# Patient Record
Sex: Female | Born: 1990 | Hispanic: No | Marital: Single | Smoking: Never smoker
Health system: Southern US, Community
[De-identification: ages and names within clinical notes are randomized; demographics above are authoritative.]

## PROBLEM LIST (undated history)

## (undated) DIAGNOSIS — F79 Unspecified intellectual disabilities: Secondary | ICD-10-CM

## (undated) DIAGNOSIS — F602 Antisocial personality disorder: Secondary | ICD-10-CM

## (undated) DIAGNOSIS — F609 Personality disorder, unspecified: Secondary | ICD-10-CM

## (undated) DIAGNOSIS — F319 Bipolar disorder, unspecified: Secondary | ICD-10-CM

## (undated) DIAGNOSIS — E221 Hyperprolactinemia: Secondary | ICD-10-CM

## (undated) DIAGNOSIS — F6081 Narcissistic personality disorder: Secondary | ICD-10-CM

## (undated) DIAGNOSIS — F259 Schizoaffective disorder, unspecified: Secondary | ICD-10-CM

## (undated) DIAGNOSIS — E119 Type 2 diabetes mellitus without complications: Secondary | ICD-10-CM

## (undated) DIAGNOSIS — D649 Anemia, unspecified: Secondary | ICD-10-CM

## (undated) DIAGNOSIS — I10 Essential (primary) hypertension: Secondary | ICD-10-CM

## (undated) DIAGNOSIS — K219 Gastro-esophageal reflux disease without esophagitis: Secondary | ICD-10-CM

## (undated) HISTORY — PX: COLPOSCOPY: SHX161

---
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
## 2018-01-07 ENCOUNTER — Encounter: Payer: Self-pay | Admitting: Emergency Medicine

## 2018-01-07 ENCOUNTER — Emergency Department
Admission: EM | Admit: 2018-01-07 | Discharge: 2018-01-09 | Disposition: A | Discharge status: Home / Self Care | Payer: Medicare Other | Attending: Emergency Medicine | Admitting: Emergency Medicine

## 2018-01-07 ENCOUNTER — Other Ambulatory Visit: Payer: Self-pay

## 2018-01-07 DIAGNOSIS — E119 Type 2 diabetes mellitus without complications: Secondary | ICD-10-CM | POA: Insufficient documentation

## 2018-01-07 DIAGNOSIS — Z765 Malingerer [conscious simulation]: Secondary | ICD-10-CM | POA: Diagnosis not present

## 2018-01-07 DIAGNOSIS — R45851 Suicidal ideations: Secondary | ICD-10-CM | POA: Diagnosis not present

## 2018-01-07 DIAGNOSIS — F259 Schizoaffective disorder, unspecified: Secondary | ICD-10-CM | POA: Insufficient documentation

## 2018-01-07 DIAGNOSIS — F79 Unspecified intellectual disabilities: Secondary | ICD-10-CM

## 2018-01-07 DIAGNOSIS — I10 Essential (primary) hypertension: Secondary | ICD-10-CM | POA: Insufficient documentation

## 2018-01-07 HISTORY — DX: Essential (primary) hypertension: I10

## 2018-01-07 HISTORY — DX: Schizoaffective disorder, unspecified: F25.9

## 2018-01-07 HISTORY — DX: Type 2 diabetes mellitus without complications: E11.9

## 2018-01-07 HISTORY — DX: Personality disorder, unspecified: F60.9

## 2018-01-07 HISTORY — DX: Bipolar disorder, unspecified: F31.9

## 2018-01-07 HISTORY — DX: Unspecified intellectual disabilities: F79

## 2018-01-07 LAB — COMPREHENSIVE METABOLIC PANEL
ALK PHOS: 81 U/L (ref 38–126)
ALT: 66 U/L — AB (ref 0–44)
ANION GAP: 11 (ref 5–15)
AST: 72 U/L — ABNORMAL HIGH (ref 15–41)
Albumin: 3.4 g/dL — ABNORMAL LOW (ref 3.5–5.0)
BILIRUBIN TOTAL: 0.4 mg/dL (ref 0.3–1.2)
BUN: 9 mg/dL (ref 6–20)
CALCIUM: 8.9 mg/dL (ref 8.9–10.3)
CO2: 25 mmol/L (ref 22–32)
CREATININE: 0.79 mg/dL (ref 0.44–1.00)
Chloride: 103 mmol/L (ref 98–111)
Glucose, Bld: 150 mg/dL — ABNORMAL HIGH (ref 70–99)
Potassium: 3.6 mmol/L (ref 3.5–5.1)
Sodium: 139 mmol/L (ref 135–145)
TOTAL PROTEIN: 8.1 g/dL (ref 6.5–8.1)

## 2018-01-07 LAB — URINE DRUG SCREEN, QUALITATIVE (ARMC ONLY)
Amphetamines, Ur Screen: NOT DETECTED
BARBITURATES, UR SCREEN: NOT DETECTED
BENZODIAZEPINE, UR SCRN: NOT DETECTED
COCAINE METABOLITE, UR ~~LOC~~: NOT DETECTED
Cannabinoid 50 Ng, Ur ~~LOC~~: NOT DETECTED
MDMA (Ecstasy)Ur Screen: NOT DETECTED
Methadone Scn, Ur: NOT DETECTED
Opiate, Ur Screen: NOT DETECTED
Phencyclidine (PCP) Ur S: NOT DETECTED
TRICYCLIC, UR SCREEN: POSITIVE — AB

## 2018-01-07 LAB — SALICYLATE LEVEL

## 2018-01-07 LAB — CBC
HCT: 35.1 % (ref 35.0–47.0)
HEMOGLOBIN: 11.9 g/dL — AB (ref 12.0–16.0)
MCH: 29.7 pg (ref 26.0–34.0)
MCHC: 33.8 g/dL (ref 32.0–36.0)
MCV: 87.7 fL (ref 80.0–100.0)
Platelets: 360 10*3/uL (ref 150–440)
RBC: 4 MIL/uL (ref 3.80–5.20)
RDW: 14.7 % — AB (ref 11.5–14.5)
WBC: 7.6 10*3/uL (ref 3.6–11.0)

## 2018-01-07 LAB — ACETAMINOPHEN LEVEL

## 2018-01-07 LAB — POCT PREGNANCY, URINE: Preg Test, Ur: NEGATIVE

## 2018-01-07 LAB — ETHANOL

## 2018-01-07 MED ORDER — ACETAMINOPHEN 325 MG PO TABS
650.0000 mg | ORAL_TABLET | Freq: Once | ORAL | Status: AC
  Administered 2018-01-07: 650 mg via ORAL
  Filled 2018-01-07: qty 2

## 2018-01-07 NOTE — ED Triage Notes (Signed)
Patient states she is feeling suicidal, wants to cut herself to commit suicide. States she is new to current group home, was previously living in [place] for 3 years. States she was recently hospitalized for 1 month there, but unable to tell this RN what she was in hospital for. Patient states she is mostly upset because group home won't let you leave the house (can't leave the yard). Patient also states she had many medication changes prior to going to this group home, unsure of what her medications are. Patient tearful in triage.

## 2018-01-07 NOTE — ED Notes (Signed)
Belongings: Grey socks, white shoes, blue bra, pink shirt, 1 pair jeans (no underwear), urine cup with 2 nuts in it, 1 book bag.   Attempted to contact legal guardian. Went straight to [employer]email. Left message to call back. Legal guardian = Anne Shutteryesha Hewitt [phone]). Group home number: [phone] Jonita Albee(Sonni Stone).

## 2018-01-07 NOTE — ED Provider Notes (Signed)
[facility] Emergency Department Provider Note  ____________________________________________  Time seen: Approximately 8:40 PM  I have reviewed the triage vital signs and the nursing notes.   HISTORY  Chief Complaint Suicidal    HPI Tracie Hanson is a 27 y.o. female with a history of bipolar disorder, personality disorder and schizoaffective disorder, HTN and DM, intellectual disability, presenting for SI with thoughts about cutting herself.  The patient has a new group home situation, and some recent medication changes.  The patient has no medical complaints at this time.  Past Medical History:  Diagnosis Date  . Bipolar 1 disorder (HCC)   . Diabetes mellitus without complication (HCC)   . Hypertension   . Intellectual disability   . Personality disorder (HCC)   . Schizoaffective disorder (HCC)     There are no active problems to display for this patient.   History reviewed. No pertinent surgical history.    Allergies Klonopin [clonazepam] and Other  No family history on file.  Social History Social History   Tobacco Use  . Smoking status: Never Smoker  . Smokeless tobacco: Never Used  Substance Use Topics  . Alcohol use: Never    Frequency: Never  . Drug use: Not on file    Review of Systems Constitutional: No fever/chills. Eyes: No visual changes. ENT: No sore throat. No congestion or rhinorrhea. Cardiovascular: Denies chest pain. Denies palpitations. Respiratory: Denies shortness of breath.  No cough. Gastrointestinal: No abdominal pain.  No nausea, no vomiting.  No diarrhea.  No constipation. Genitourinary: Negative for dysuria. Musculoskeletal: Negative for back pain. Skin: Negative for rash. Neurological: Negative for headaches. No focal numbness, tingling or weakness.  Psychiatric:SI with plan.  No HI or hallucinations. ____________________________________________   PHYSICAL EXAM:  VITAL SIGNS: ED Triage Vitals   Enc Vitals Group     BP 01/07/18 1712 (!) 137/93     Pulse Rate 01/07/18 1712 (!) 123     Resp 01/07/18 1712 20     Temp 01/07/18 1712 98.4 F (36.9 C)     Temp Source 01/07/18 1712 Oral     SpO2 01/07/18 1712 99 %     Weight 01/07/18 1713 290 lb (131.5 kg)     Height 01/07/18 1713 5\' 3"  (1.6 m)     Head Circumference --      Peak Flow --      Pain Score 01/07/18 1713 0     Pain Loc --      Pain Edu? --      Excl. in GC? --     Constitutional: Alert and oriented. Answers questions appropriately. Eyes: Conjunctivae are normal.  EOMI. No scleral icterus. Head: Atraumatic. Nose: No congestion/rhinnorhea. Mouth/Throat: Mucous membranes are moist.  Neck: No stridor.  Supple.  No meningismus. Cardiovascular: Normal rate, regular rhythm. No murmurs, rubs or gallops.  Respiratory: Normal respiratory effort.  No accessory muscle use or retractions. Lungs CTAB.  No wheezes, rales or ronchi. Gastrointestinal: Soft, nontender and nondistended.  No guarding or rebound.  No peritoneal signs. Musculoskeletal: No LE edema.  Neurologic:  A&Ox3.  Speech is clear.  Face and smile are symmetric.  EOMI.  Moves all extremities well. Skin:  Skin is warm, dry and intact. No rash noted. Psychiatric: Depressed mood and affect.  Endorses SI without HI or hallucinations on my examination.  ____________________________________________   LABS (all labs ordered are listed, but only abnormal results are displayed)  Labs Reviewed  COMPREHENSIVE METABOLIC PANEL - Abnormal; Notable for  the following components:      Result Value   Glucose, Bld 150 (*)    Albumin 3.4 (*)    AST 72 (*)    ALT 66 (*)    All other components within normal limits  ACETAMINOPHEN LEVEL - Abnormal; Notable for the following components:   Acetaminophen (Tylenol), Serum <10 (*)    All other components within normal limits  CBC - Abnormal; Notable for the following components:   Hemoglobin 11.9 (*)    RDW 14.7 (*)    All  other components within normal limits  URINE DRUG SCREEN, QUALITATIVE ([facility] ONLY) - Abnormal; Notable for the following components:   Tricyclic, Ur Screen POSITIVE (*)    All other components within normal limits  ETHANOL  SALICYLATE LEVEL  POC URINE PREG, ED  POCT PREGNANCY, URINE   ____________________________________________  EKG  Not indicated ____________________________________________  RADIOLOGY  No results found.  ____________________________________________   PROCEDURES  Procedure(s) performed: None  Procedures  Critical Care performed: No ____________________________________________   INITIAL IMPRESSION / ASSESSMENT AND PLAN / ED COURSE  Pertinent labs & imaging results that were available during my care of the patient were reviewed by me and considered in my medical decision making (see chart for details).  27 y.o. female with a history of intellectual disability, bipolar disorder, schizoaffective disorder, intellectual disability, presenting for SI with plan to cut herself.  Overall, the patient is hemodynamically stable.  Her laboratory studies are reassuring.  At this time, the patient is medically cleared for psychiatric disposition.  She has been placed under involuntary commitment, and psychiatry and TTS consultations have been ordered.  ____________________________________________  FINAL CLINICAL IMPRESSION(S) / ED DIAGNOSES  Final diagnoses:  Suicidal ideation         NEW MEDICATIONS STARTED DURING THIS VISIT:  New Prescriptions   No medications on file      Rockne MenghiniNorman, Anne-Caroline, MD 01/07/18 2224

## 2018-01-07 NOTE — ED Notes (Signed)
SOC in progress with Dr Mordecai MaesSanchez

## 2018-01-08 DIAGNOSIS — E119 Type 2 diabetes mellitus without complications: Secondary | ICD-10-CM

## 2018-01-08 DIAGNOSIS — F79 Unspecified intellectual disabilities: Secondary | ICD-10-CM

## 2018-01-08 DIAGNOSIS — I10 Essential (primary) hypertension: Secondary | ICD-10-CM

## 2018-01-08 DIAGNOSIS — F259 Schizoaffective disorder, unspecified: Secondary | ICD-10-CM | POA: Diagnosis not present

## 2018-01-08 DIAGNOSIS — Z765 Malingerer [conscious simulation]: Secondary | ICD-10-CM

## 2018-01-08 MED ORDER — ZIPRASIDONE MESYLATE 20 MG IM SOLR
20.0000 mg | Freq: Once | INTRAMUSCULAR | Status: AC
  Administered 2018-01-08: 20 mg via INTRAMUSCULAR
  Filled 2018-01-08: qty 20

## 2018-01-08 MED ORDER — TRAZODONE HCL 100 MG PO TABS
ORAL_TABLET | ORAL | Status: AC
  Filled 2018-01-08: qty 1

## 2018-01-08 MED ORDER — ONDANSETRON 4 MG PO TBDP
ORAL_TABLET | ORAL | Status: AC
  Filled 2018-01-08: qty 1

## 2018-01-08 MED ORDER — TRAZODONE HCL 100 MG PO TABS: 100.0000 mg | ORAL_TABLET | Freq: Every day | ORAL | Status: DC

## 2018-01-08 MED ORDER — ONDANSETRON 4 MG PO TBDP
4.0000 mg | ORAL_TABLET | Freq: Once | ORAL | Status: AC
  Administered 2018-01-08: 4 mg via ORAL

## 2018-01-08 NOTE — ED Notes (Signed)
PT IVC PENDING PSYCH ADMISSION
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
## 2018-01-08 NOTE — BH Assessment (Signed)
Patient has been seen by Psych MD (Clapacs) and will be discharge.  TTS isn't needed at this time.  As it stands pt's g/h is stating that the pt is not allowed to come back which may result in a social work consult being placed for the pt.

## 2018-01-08 NOTE — ED Notes (Signed)
Dr. Toni Amendlapacs rescinded papers  [phone]

## 2018-01-08 NOTE — ED Notes (Addendum)
[name] from group home called and stated she (or another staff member) will be here tonight to pick up patient.  No specific time given.

## 2018-01-08 NOTE — ED Notes (Signed)
RN called group home to make staff aware of patient's discharge. Staff member stated she would have to speak with her supervisor, but believed the patient would not be allowed back. RN waiting for a return phone call to confirm.

## 2018-01-08 NOTE — ED Provider Notes (Signed)
Patient seen by Dr. Mat Carnelay packs.  He advises discharge the patient.  Patient has been discharged and waiting for a ride.  She vomits once.  She now feels better again.  She is slightly nauseated watch her for a little bit and try some Zofran and make sure she is okay.   Arnaldo NatalMalinda, Jailene Cupit F, MD 01/08/18 1843

## 2018-01-08 NOTE — Progress Notes (Signed)
LCSW provided support to patient when she became upset in her room. In discussion with Dr Toni Amendlapacs the patient is to discharge back to group home.  No further needs at this time  Tracie Faciane LCSW

## 2018-01-08 NOTE — Discharge Instructions (Signed)
Please return to the emergency room as needed.  Please follow-up with your regular doctor later on this coming week.  Follow-up with [facility] if you need someone to talk to as well.  Their contact information is on the other sheet of paper we gave you.

## 2018-01-08 NOTE — BH Assessment (Signed)
Pt's Social Worker [employer] Hewett called to let staff know that she is aware of her client's presence in the ED and that she will not be accepting any calls for the remainder of the day.

## 2018-01-08 NOTE — ED Notes (Signed)
RN called legal guardian to notify her of patient's discharge. Phone went directly to voicemail. RN left message and requested return phone call.

## 2018-01-08 NOTE — ED Provider Notes (Signed)
-----------------------------------------   4:10 AM on 01/08/2018 -----------------------------------------  [facility], hold and monitor overnight and reassess again tomorrow for final disposition.  I will consult in-house psychiatry to evaluate the patient tomorrow.   Irean HongSung, Jade J, MD 01/08/18 0430

## 2018-01-08 NOTE — ED Notes (Signed)
Pt began crying and screaming hysterically. Directed to her room by Engineer, materialssecurity officer. Staff (RN, security, LCSW) unable to verbally deescalte patient. EDP made aware via charge nurse. IM medication ordered and administered.   Maintained on 15 minute checks and observation by security camera for safety.

## 2018-01-09 NOTE — ED Provider Notes (Signed)
-----------------------------------------   7:08 AM on 01/09/2018 -----------------------------------------   Blood pressure (!) 142/107, pulse 91, temperature 98.4 F (36.9 C), temperature source Oral, resp. rate 12, height 5\' 3"  (1.6 m), weight 131.5 kg, last menstrual period 12/07/2017, SpO2 100 %.  The patient had no acute events since last update.  Calm and cooperative at this time.     Willy Eddyobinson, Cynia Abruzzo, MD 01/09/18 [phone]

## 2018-01-09 NOTE — BH Assessment (Signed)
Per ED Secretary Tracie Hanson, group home will pick up patient once they received information from patient's legal guardian Tracie Hanson(Tracie Hanson [phone]) Mr. Tracie BameRonnie contacted patient legal guardian and they stated they will fax needed information to group home in an hour.

## 2018-01-09 NOTE — ED Notes (Signed)

## 2018-01-09 NOTE — ED Notes (Signed)
Patient received PM snack. 

## 2018-01-09 NOTE — ED Notes (Signed)
[facility] call was [facility] from [facility]([phone]); to state that; she is waiting on patient's  Guardian to bring some signed paperwork; before coming to pick patient up; as, she will be here([facility]) tomorrow to pick Patient up.

## 2018-01-09 NOTE — Progress Notes (Signed)
Pt A & O X4. Denies SI, HI, AVH and pain when assessed. Irritable, anxious on interaction related to placement "I don't want to go there, I will kill her if I have to go back there, I want to talk the social worker here". D/C process reviewed with pt by both Clinical research associatewriter and CSW, understanding verbalized. Pt was redirectable "I understand, I just need somewhere to go, I don't like that lady but I will behave". Emotional support, encouragement and availability provided to pt. Vitals done, WNL. Pt tolerated all PO intake well. Safety checks remains effective till time of d/c without self harm gestures or outburst.

## 2018-01-09 NOTE — BH Assessment (Signed)
This Clinical research associatewriter called [facility] at [phone] to get an ETA to pick up patient from [facility]. Left HIPPA compliant message for return call.

## 2018-01-09 NOTE — BH Assessment (Signed)
Per ED Secretary Ronnie, patient is being sent home via taxi.

## 2018-03-11 ENCOUNTER — Other Ambulatory Visit: Payer: Self-pay

## 2018-03-11 ENCOUNTER — Emergency Department
Admission: EM | Admit: 2018-03-11 | Discharge: 2018-03-12 | Disposition: A | Discharge status: Home / Self Care | Payer: Medicare Other | Attending: Emergency Medicine | Admitting: Emergency Medicine

## 2018-03-11 DIAGNOSIS — E119 Type 2 diabetes mellitus without complications: Secondary | ICD-10-CM | POA: Insufficient documentation

## 2018-03-11 DIAGNOSIS — R456 Violent behavior: Secondary | ICD-10-CM | POA: Diagnosis not present

## 2018-03-11 DIAGNOSIS — F259 Schizoaffective disorder, unspecified: Secondary | ICD-10-CM | POA: Insufficient documentation

## 2018-03-11 DIAGNOSIS — F79 Unspecified intellectual disabilities: Secondary | ICD-10-CM | POA: Insufficient documentation

## 2018-03-11 DIAGNOSIS — R4689 Other symptoms and signs involving appearance and behavior: Secondary | ICD-10-CM

## 2018-03-11 DIAGNOSIS — F3110 Bipolar disorder, current episode manic without psychotic features, unspecified: Secondary | ICD-10-CM | POA: Diagnosis not present

## 2018-03-11 DIAGNOSIS — F99 Mental disorder, not otherwise specified: Secondary | ICD-10-CM | POA: Diagnosis present

## 2018-03-11 LAB — POCT PREGNANCY, URINE: PREG TEST UR: NEGATIVE
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
## 2018-03-12 LAB — CBC
HCT: 32 % — ABNORMAL LOW (ref 36.0–46.0)
HEMOGLOBIN: 10 g/dL — AB (ref 12.0–15.0)
MCH: 27.6 pg (ref 26.0–34.0)
MCHC: 31.3 g/dL (ref 30.0–36.0)
MCV: 88.4 fL (ref 80.0–100.0)
NRBC: 0 % (ref 0.0–0.2)
PLATELETS: 338 10*3/uL (ref 150–400)
RBC: 3.62 MIL/uL — AB (ref 3.87–5.11)
RDW: 13.8 % (ref 11.5–15.5)
WBC: 9.2 10*3/uL (ref 4.0–10.5)

## 2018-03-12 LAB — URINE DRUG SCREEN, QUALITATIVE (ARMC ONLY)
Amphetamines, Ur Screen: NOT DETECTED
BARBITURATES, UR SCREEN: NOT DETECTED
Benzodiazepine, Ur Scrn: NOT DETECTED
CANNABINOID 50 NG, UR ~~LOC~~: NOT DETECTED
Cocaine Metabolite,Ur ~~LOC~~: NOT DETECTED
MDMA (ECSTASY) UR SCREEN: NOT DETECTED
METHADONE SCREEN, URINE: NOT DETECTED
Opiate, Ur Screen: NOT DETECTED
Phencyclidine (PCP) Ur S: NOT DETECTED
Tricyclic, Ur Screen: POSITIVE — AB

## 2018-03-12 LAB — COMPREHENSIVE METABOLIC PANEL
ALBUMIN: 3.3 g/dL — AB (ref 3.5–5.0)
ALT: 15 U/L (ref 0–44)
ANION GAP: 8 (ref 5–15)
AST: 28 U/L (ref 15–41)
Alkaline Phosphatase: 66 U/L (ref 38–126)
BUN: 10 mg/dL (ref 6–20)
CALCIUM: 8.6 mg/dL — AB (ref 8.9–10.3)
CHLORIDE: 103 mmol/L (ref 98–111)
CO2: 27 mmol/L (ref 22–32)
Creatinine, Ser: 0.82 mg/dL (ref 0.44–1.00)
GFR calc non Af Amer: >60 mL/min (ref >60)
GLUCOSE: 127 mg/dL — AB (ref 70–99)
POTASSIUM: 4.1 mmol/L (ref 3.5–5.1)
SODIUM: 138 mmol/L (ref 135–145)
Total Bilirubin: 0.3 mg/dL (ref 0.3–1.2)
Total Protein: 7.4 g/dL (ref 6.5–8.1)

## 2018-03-12 LAB — SALICYLATE LEVEL

## 2018-03-12 LAB — ACETAMINOPHEN LEVEL

## 2018-03-12 LAB — ETHANOL: Alcohol, Ethyl (B): <10 mg/dL (ref <10)

## 2018-03-12 NOTE — ED Notes (Signed)
[facility]  [place] [facility]  pts guardian  [phone]

## 2018-03-12 NOTE — ED Notes (Signed)
ED  Is the patient under IVC or is there intent for IVC: no  Is the patient medically cleared: Yes.   Is there vacancy in the ED BHU: Yes.   Is the population mix appropriate for patient: Yes.   Is the patient awaiting placement in inpatient or outpatient setting:  Pt discharged - awaiting caregiver to pick her up  Has the patient had a psychiatric consult: Yes.   Survey of unit performed for contraband, proper placement and condition of furniture, tampering with fixtures in bathroom, shower, and each patient room: Yes.  ; Findings:  APPEARANCE/BEHAVIOR Calm and cooperative NEURO ASSESSMENT Orientation: oriented x3  Denies pain Hallucinations:  denies at present  Speech: Normal Gait: normal RESPIRATORY ASSESSMENT Even  Unlabored respirations  CARDIOVASCULAR ASSESSMENT Pulses equal   regular rate  Skin warm and dry   GASTROINTESTINAL ASSESSMENT no GI complaint EXTREMITIES Full ROM  PLAN OF CARE Provide calm/safe environment. Vital signs assessed twice daily. ED BHU Assessment once each 12-hour shift. Collaborate with TTS daily or as condition indicates. Assure the ED provider has rounded once each shift. Provide and encourage hygiene. Provide redirection as needed. Assess for escalating behavior; address immediately and inform ED provider.  Assess family dynamic and appropriateness for visitation as needed: Yes.  ; If necessary, describe findings:  Educate the patient/family about BHU procedures/visitation: Yes.  ; If necessary, describe findings:

## 2018-03-12 NOTE — ED Notes (Addendum)
Called and spoke with Tracie BourgeoisLatoya Hanson  [phone]  She reports that someone from the group home will be here to pick this pt up    pts legal guardian  Susette Raceryeisha Hewett  [place] [facility]  [phone]

## 2018-03-12 NOTE — ED Notes (Signed)
BEHAVIORAL HEALTH ROUNDING Patient sleeping: No. Patient alert and oriented: yes Behavior appropriate: Yes.  ; If no, describe:  Nutrition and fluids offered: yes Toileting and hygiene offered: Yes  Sitter present: q15 minute observations and security  monitoring Law enforcement present: Yes  ODS  

## 2018-03-12 NOTE — ED Notes (Signed)
Report given to SOC. SOC in progress.  
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
## 2018-03-12 NOTE — ED Notes (Signed)
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
## 2018-03-12 NOTE — ED Notes (Signed)
Pt. Going home with caretaker(Latoya Eulah PontMurphy)

## 2018-03-12 NOTE — ED Notes (Signed)
Patient observed lying in bed with eyes closed  Even, unlabored respirations observed   NAD pt appears to be sleeping  I will continue to monitor along with every 15 minute visual observations and ongoing security monitoring    

## 2018-03-12 NOTE — ED Notes (Signed)
Officer reported that a safety visit was done at group home, officer told at address that Tracie BourgeoisLatoya Murphy was on the way to hospital to pick up patient.

## 2018-03-12 NOTE — ED Notes (Signed)
Informed Jobe GibbonLatoy Murphy has arrived to waiting room to pick up patient.  Pt. Given personal clothes to change.

## 2018-03-12 NOTE — Discharge Instructions (Addendum)
Today you were seen and evaluated by a board certified psychiatrist who felt it was safe for you to go home.  Please follow up with both your psychiatrist and your PMD as scheduled and return to the ED sooner for any concerns whatsoever.  It was a pleasure to take care of you today, and thank you for coming to our emergency department.  If you have any questions or concerns before leaving please ask the nurse to grab me and I'm more than happy to go through your aftercare instructions again.  If you were prescribed any opioid pain medication today such as Norco, Vicodin, Percocet, morphine, hydrocodone, or oxycodone please make sure you do not drive when you are taking this medication as it can alter your ability to drive safely.  If you have any concerns once you are home that you are not improving or are in fact getting worse before you can make it to your follow-up appointment, please do not hesitate to call 911 and come back for further evaluation.  Merrily Brittle, MD  Results for orders placed or performed during the hospital encounter of 03/11/18  Comprehensive metabolic panel  Result Value Ref Range   Sodium 138 135 - 145 mmol/L   Potassium 4.1 3.5 - 5.1 mmol/L   Chloride 103 98 - 111 mmol/L   CO2 27 22 - 32 mmol/L   Glucose, Bld 127 (H) 70 - 99 mg/dL   BUN 10 6 - 20 mg/dL   Creatinine, Ser 4.13 0.44 - 1.00 mg/dL   Calcium 8.6 (L) 8.9 - 10.3 mg/dL   Total Protein 7.4 6.5 - 8.1 g/dL   Albumin 3.3 (L) 3.5 - 5.0 g/dL   AST 28 15 - 41 U/L   ALT 15 0 - 44 U/L   Alkaline Phosphatase 66 38 - 126 U/L   Total Bilirubin 0.3 0.3 - 1.2 mg/dL   GFR calc non Af Amer >60 >60 mL/min   GFR calc Af Amer >60 >60 mL/min   Anion gap 8 5 - 15  Ethanol  Result Value Ref Range   Alcohol, Ethyl (B) <10 <10 mg/dL  Salicylate level  Result Value Ref Range   Salicylate Lvl <7.0 2.8 - 30.0 mg/dL  Acetaminophen level  Result Value Ref Range   Acetaminophen (Tylenol), Serum <10 (L) 10 - 30 ug/mL    cbc  Result Value Ref Range   WBC 9.2 4.0 - 10.5 K/uL   RBC 3.62 (L) 3.87 - 5.11 MIL/uL   Hemoglobin 10.0 (L) 12.0 - 15.0 g/dL   HCT 24.4 (L) 01.0 - 27.2 %   MCV 88.4 80.0 - 100.0 fL   MCH 27.6 26.0 - 34.0 pg   MCHC 31.3 30.0 - 36.0 g/dL   RDW 53.6 64.4 - 03.4 %   Platelets 338 150 - 400 K/uL   nRBC 0.0 0.0 - 0.2 %  Urine Drug Screen, Qualitative  Result Value Ref Range   Tricyclic, Ur Screen POSITIVE (A) NONE DETECTED   Amphetamines, Ur Screen NONE DETECTED NONE DETECTED   MDMA (Ecstasy)Ur Screen NONE DETECTED NONE DETECTED   Cocaine Metabolite,Ur [place] NONE DETECTED NONE DETECTED   Opiate, Ur Screen NONE DETECTED NONE DETECTED   Phencyclidine (PCP) Ur S NONE DETECTED NONE DETECTED   Cannabinoid 50 Ng, Ur [place] NONE DETECTED NONE DETECTED   Barbiturates, Ur Screen NONE DETECTED NONE DETECTED   Benzodiazepine, Ur Scrn NONE DETECTED NONE DETECTED   Methadone Scn, Ur NONE DETECTED NONE DETECTED  Pregnancy, urine POC  Result Value Ref Range   Preg Test, Ur NEGATIVE NEGATIVE      It was a pleasure to take care of you today, and thank you for coming to our emergency department.  If you have any questions or concerns before leaving please ask the nurse to grab me and I'm more than happy to go through your aftercare instructions again.  If you have any concerns once you are home that you are not improving or are in fact getting worse before you can make it to your follow-up appointment, please do not hesitate to call 911 and come back for further evaluation.  Merrily BrittleNeil Kye Silverstein, MD  Results for orders placed or performed during the hospital encounter of 03/11/18  Comprehensive metabolic panel  Result Value Ref Range   Sodium 138 135 - 145 mmol/L   Potassium 4.1 3.5 - 5.1 mmol/L   Chloride 103 98 - 111 mmol/L   CO2 27 22 - 32 mmol/L   Glucose, Bld 127 (H) 70 - 99 mg/dL   BUN 10 6 - 20 mg/dL   Creatinine, Ser 4.540.82 0.44 - 1.00 mg/dL   Calcium 8.6 (L) 8.9 - 10.3 mg/dL   Total Protein 7.4  6.5 - 8.1 g/dL   Albumin 3.3 (L) 3.5 - 5.0 g/dL   AST 28 15 - 41 U/L   ALT 15 0 - 44 U/L   Alkaline Phosphatase 66 38 - 126 U/L   Total Bilirubin 0.3 0.3 - 1.2 mg/dL   GFR calc non Af Amer >60 >60 mL/min   GFR calc Af Amer >60 >60 mL/min   Anion gap 8 5 - 15  Ethanol  Result Value Ref Range   Alcohol, Ethyl (B) <10 <10 mg/dL  Salicylate level  Result Value Ref Range   Salicylate Lvl <7.0 2.8 - 30.0 mg/dL  Acetaminophen level  Result Value Ref Range   Acetaminophen (Tylenol), Serum <10 (L) 10 - 30 ug/mL  cbc  Result Value Ref Range   WBC 9.2 4.0 - 10.5 K/uL   RBC 3.62 (L) 3.87 - 5.11 MIL/uL   Hemoglobin 10.0 (L) 12.0 - 15.0 g/dL   HCT 09.832.0 (L) 11.936.0 - 14.746.0 %   MCV 88.4 80.0 - 100.0 fL   MCH 27.6 26.0 - 34.0 pg   MCHC 31.3 30.0 - 36.0 g/dL   RDW 82.913.8 56.211.5 - 13.015.5 %   Platelets 338 150 - 400 K/uL   nRBC 0.0 0.0 - 0.2 %  Urine Drug Screen, Qualitative  Result Value Ref Range   Tricyclic, Ur Screen POSITIVE (A) NONE DETECTED   Amphetamines, Ur Screen NONE DETECTED NONE DETECTED   MDMA (Ecstasy)Ur Screen NONE DETECTED NONE DETECTED   Cocaine Metabolite,Ur [place] NONE DETECTED NONE DETECTED   Opiate, Ur Screen NONE DETECTED NONE DETECTED   Phencyclidine (PCP) Ur S NONE DETECTED NONE DETECTED   Cannabinoid 50 Ng, Ur  NONE DETECTED NONE DETECTED   Barbiturates, Ur Screen NONE DETECTED NONE DETECTED   Benzodiazepine, Ur Scrn NONE DETECTED NONE DETECTED   Methadone Scn, Ur NONE DETECTED NONE DETECTED  Pregnancy, urine POC  Result Value Ref Range   Preg Test, Ur NEGATIVE NEGATIVE

## 2018-03-12 NOTE — BH Assessment (Signed)
Per SOC pt to be d/c back to her group home.

## 2018-03-12 NOTE — ED Notes (Signed)
Gave pt food tray with juice. 

## 2018-03-19 ENCOUNTER — Emergency Department: Payer: Medicare Other

## 2018-03-19 ENCOUNTER — Other Ambulatory Visit: Payer: Self-pay

## 2018-03-19 ENCOUNTER — Inpatient Hospital Stay
Admission: EM | Admit: 2018-03-19 | Discharge: 2018-03-22 | DRG: 445 | Disposition: A | Discharge status: Home / Self Care | Payer: Medicare Other | Attending: Surgery | Admitting: Surgery

## 2018-03-19 DIAGNOSIS — F79 Unspecified intellectual disabilities: Secondary | ICD-10-CM | POA: Diagnosis present

## 2018-03-19 DIAGNOSIS — Z6841 Body Mass Index (BMI) 40.0 and over, adult: Secondary | ICD-10-CM

## 2018-03-19 DIAGNOSIS — Z888 Allergy status to other drugs, medicaments and biological substances status: Secondary | ICD-10-CM

## 2018-03-19 DIAGNOSIS — R1011 Right upper quadrant pain: Secondary | ICD-10-CM

## 2018-03-19 DIAGNOSIS — F609 Personality disorder, unspecified: Secondary | ICD-10-CM | POA: Diagnosis present

## 2018-03-19 DIAGNOSIS — K810 Acute cholecystitis: Secondary | ICD-10-CM | POA: Diagnosis present

## 2018-03-19 DIAGNOSIS — K8000 Calculus of gallbladder with acute cholecystitis without obstruction: Principal | ICD-10-CM | POA: Diagnosis present

## 2018-03-19 DIAGNOSIS — Z91018 Allergy to other foods: Secondary | ICD-10-CM

## 2018-03-19 DIAGNOSIS — I10 Essential (primary) hypertension: Secondary | ICD-10-CM | POA: Diagnosis present

## 2018-03-19 DIAGNOSIS — E119 Type 2 diabetes mellitus without complications: Secondary | ICD-10-CM | POA: Diagnosis present

## 2018-03-19 DIAGNOSIS — F250 Schizoaffective disorder, bipolar type: Secondary | ICD-10-CM | POA: Diagnosis present

## 2018-03-19 LAB — COMPREHENSIVE METABOLIC PANEL
ALK PHOS: 57 U/L (ref 38–126)
ALT: 22 U/L (ref 0–44)
ANION GAP: 9 (ref 5–15)
AST: 30 U/L (ref 15–41)
Albumin: 3.3 g/dL — ABNORMAL LOW (ref 3.5–5.0)
BUN: 13 mg/dL (ref 6–20)
CALCIUM: 9 mg/dL (ref 8.9–10.3)
CHLORIDE: 103 mmol/L (ref 98–111)
CO2: 27 mmol/L (ref 22–32)
Creatinine, Ser: 0.85 mg/dL (ref 0.44–1.00)
GFR calc non Af Amer: >60 mL/min (ref >60)
Glucose, Bld: 127 mg/dL — ABNORMAL HIGH (ref 70–99)
Potassium: 4 mmol/L (ref 3.5–5.1)
SODIUM: 139 mmol/L (ref 135–145)
Total Bilirubin: 0.4 mg/dL (ref 0.3–1.2)
Total Protein: 7.6 g/dL (ref 6.5–8.1)

## 2018-03-19 LAB — CBC WITH DIFFERENTIAL/PLATELET
ABS IMMATURE GRANULOCYTES: 0.06 10*3/uL (ref 0.00–0.07)
Basophils Absolute: 0 10*3/uL (ref 0.0–0.1)
Basophils Relative: 0 %
Eosinophils Absolute: 0.1 10*3/uL (ref 0.0–0.5)
Eosinophils Relative: 1 %
HCT: 32.6 % — ABNORMAL LOW (ref 36.0–46.0)
Hemoglobin: 10.3 g/dL — ABNORMAL LOW (ref 12.0–15.0)
IMMATURE GRANULOCYTES: 1 %
LYMPHS ABS: 4 10*3/uL (ref 0.7–4.0)
LYMPHS PCT: 37 %
MCH: 27.7 pg (ref 26.0–34.0)
MCHC: 31.6 g/dL (ref 30.0–36.0)
MCV: 87.6 fL (ref 80.0–100.0)
MONOS PCT: 12 %
Monocytes Absolute: 1.3 10*3/uL — ABNORMAL HIGH (ref 0.1–1.0)
NEUTROS ABS: 5.4 10*3/uL (ref 1.7–7.7)
Neutrophils Relative %: 49 %
Platelets: 368 10*3/uL (ref 150–400)
RBC: 3.72 MIL/uL — ABNORMAL LOW (ref 3.87–5.11)
RDW: 14.6 % (ref 11.5–15.5)
WBC: 10.9 10*3/uL — ABNORMAL HIGH (ref 4.0–10.5)
nRBC: 0 % (ref 0.0–0.2)

## 2018-03-19 LAB — URINALYSIS, COMPLETE (UACMP) WITH MICROSCOPIC
BILIRUBIN URINE: NEGATIVE
Bacteria, UA: NONE SEEN
GLUCOSE, UA: NEGATIVE mg/dL
HGB URINE DIPSTICK: NEGATIVE
Ketones, ur: 5 mg/dL — AB
NITRITE: NEGATIVE
Protein, ur: 100 mg/dL — AB
SPECIFIC GRAVITY, URINE: 1.02 (ref 1.005–1.030)
pH: 7 (ref 5.0–8.0)

## 2018-03-19 LAB — GLUCOSE, CAPILLARY
GLUCOSE-CAPILLARY: 85 mg/dL (ref 70–99)
Glucose-Capillary: 106 mg/dL — ABNORMAL HIGH (ref 70–99)
Glucose-Capillary: 98 mg/dL (ref 70–99)

## 2018-03-19 LAB — LIPASE, BLOOD: LIPASE: 42 U/L (ref 11–51)

## 2018-03-19 MED ORDER — OMEGA-3-ACID ETHYL ESTERS 1 G PO CAPS
1.0000 g | ORAL_CAPSULE | Freq: Three times a day (TID) | ORAL | Status: DC
  Filled 2018-03-19 (×6): qty 1

## 2018-03-19 MED ORDER — ONDANSETRON 4 MG PO TBDP: 4.0000 mg | ORAL_TABLET | Freq: Four times a day (QID) | ORAL | Status: DC | PRN

## 2018-03-19 MED ORDER — MELATONIN 5 MG PO TABS
1.0000 | ORAL_TABLET | Freq: Every day | ORAL | Status: DC
  Administered 2018-03-20 – 2018-03-22 (×3): 5 mg via ORAL
  Filled 2018-03-19 (×4): qty 1

## 2018-03-19 MED ORDER — ENOXAPARIN SODIUM 40 MG/0.4ML ~~LOC~~ SOLN
40.0000 mg | SUBCUTANEOUS | Status: DC
  Filled 2018-03-19 (×2): qty 0.4

## 2018-03-19 MED ORDER — DOCUSATE SODIUM 100 MG PO CAPS: 100.0000 mg | ORAL_CAPSULE | Freq: Two times a day (BID) | ORAL | Status: DC | PRN

## 2018-03-19 MED ORDER — LACTATED RINGERS IV SOLN
INTRAVENOUS | Status: DC
  Administered 2018-03-19 – 2018-03-20 (×4): via INTRAVENOUS

## 2018-03-19 MED ORDER — MORPHINE SULFATE (PF) 2 MG/ML IV SOLN: 2.0000 mg | INTRAVENOUS | Status: DC | PRN

## 2018-03-19 MED ORDER — HYDROXYZINE HCL 25 MG PO TABS: 25.0000 mg | ORAL_TABLET | Freq: Four times a day (QID) | ORAL | Status: DC | PRN

## 2018-03-19 MED ORDER — DIVALPROEX SODIUM 500 MG PO DR TAB
1500.0000 mg | DELAYED_RELEASE_TABLET | Freq: Two times a day (BID) | ORAL | Status: DC
  Administered 2018-03-19 – 2018-03-20 (×2): 1500 mg via ORAL
  Filled 2018-03-19 (×4): qty 3

## 2018-03-19 MED ORDER — MOMETASONE FURO-FORMOTEROL FUM 200-5 MCG/ACT IN AERO
2.0000 | INHALATION_SPRAY | Freq: Two times a day (BID) | RESPIRATORY_TRACT | Status: DC
  Administered 2018-03-19 – 2018-03-22 (×7): 2 via RESPIRATORY_TRACT
  Filled 2018-03-19: qty 8.8

## 2018-03-19 MED ORDER — ALBUTEROL SULFATE (2.5 MG/3ML) 0.083% IN NEBU: 2.5000 mg | INHALATION_SOLUTION | Freq: Four times a day (QID) | RESPIRATORY_TRACT | Status: DC | PRN

## 2018-03-19 MED ORDER — LISINOPRIL 20 MG PO TABS
40.0000 mg | ORAL_TABLET | Freq: Every day | ORAL | Status: DC
  Administered 2018-03-19 – 2018-03-22 (×4): 40 mg via ORAL
  Filled 2018-03-19 (×4): qty 2

## 2018-03-19 MED ORDER — MORPHINE SULFATE (PF) 2 MG/ML IV SOLN
2.0000 mg | Freq: Once | INTRAVENOUS | Status: AC
  Administered 2018-03-19: 2 mg via INTRAVENOUS
  Filled 2018-03-19: qty 1

## 2018-03-19 MED ORDER — IOHEXOL 300 MG/ML  SOLN
125.0000 mL | Freq: Once | INTRAMUSCULAR | Status: AC | PRN
  Administered 2018-03-19: 125 mL via INTRAVENOUS

## 2018-03-19 MED ORDER — ALUM & MAG HYDROXIDE-SIMETH 200-200-20 MG/5ML PO SUSP: 30.0000 mL | Freq: Four times a day (QID) | ORAL | Status: DC | PRN

## 2018-03-19 MED ORDER — DIVALPROEX SODIUM 500 MG PO DR TAB
2000.0000 mg | DELAYED_RELEASE_TABLET | Freq: Every day | ORAL | Status: DC
  Administered 2018-03-20 – 2018-03-21 (×3): 2000 mg via ORAL
  Filled 2018-03-19 (×4): qty 4

## 2018-03-19 MED ORDER — POLYETHYLENE GLYCOL 3350 17 G PO PACK: 17.0000 g | PACK | Freq: Every day | ORAL | Status: DC | PRN

## 2018-03-19 MED ORDER — ONDANSETRON HCL 4 MG/2ML IJ SOLN: 4.0000 mg | Freq: Four times a day (QID) | INTRAMUSCULAR | Status: DC | PRN

## 2018-03-19 MED ORDER — LEVONORGEST-ETH ESTRADIOL-IRON 0.1-20 MG-MCG(21) PO TABS: 1.0000 | ORAL_TABLET | Freq: Every day | ORAL | Status: DC

## 2018-03-19 MED ORDER — MELATONIN 3 MG PO TABS
6.0000 mg | ORAL_TABLET | Freq: Every day | ORAL | Status: DC
  Filled 2018-03-19: qty 2

## 2018-03-19 MED ORDER — QUETIAPINE FUMARATE 200 MG PO TABS
200.0000 mg | ORAL_TABLET | Freq: Every day | ORAL | Status: DC
  Administered 2018-03-20 – 2018-03-21 (×3): 200 mg via ORAL
  Filled 2018-03-19 (×4): qty 1

## 2018-03-19 MED ORDER — FAMOTIDINE 20 MG PO TABS
20.0000 mg | ORAL_TABLET | Freq: Every day | ORAL | Status: DC
  Administered 2018-03-19 – 2018-03-22 (×4): 20 mg via ORAL
  Filled 2018-03-19 (×4): qty 1

## 2018-03-19 MED ORDER — HYDROCHLOROTHIAZIDE 25 MG PO TABS
12.5000 mg | ORAL_TABLET | Freq: Every day | ORAL | Status: DC
  Administered 2018-03-19 – 2018-03-22 (×4): 12.5 mg via ORAL
  Filled 2018-03-19 (×4): qty 1

## 2018-03-19 MED ORDER — TRAMADOL HCL 50 MG PO TABS: 50.0000 mg | ORAL_TABLET | Freq: Four times a day (QID) | ORAL | Status: DC | PRN

## 2018-03-19 MED ORDER — PRAZOSIN HCL 2 MG PO CAPS
4.0000 mg | ORAL_CAPSULE | Freq: Every day | ORAL | Status: DC
  Administered 2018-03-20 – 2018-03-21 (×3): 4 mg via ORAL
  Filled 2018-03-19 (×4): qty 2

## 2018-03-19 MED ORDER — HYDROCODONE-ACETAMINOPHEN 5-325 MG PO TABS
1.0000 | ORAL_TABLET | ORAL | Status: DC | PRN
  Administered 2018-03-19: 1 via ORAL
  Filled 2018-03-19: qty 1

## 2018-03-19 MED ORDER — INSULIN ASPART 100 UNIT/ML ~~LOC~~ SOLN
0.0000 [IU] | Freq: Three times a day (TID) | SUBCUTANEOUS | Status: DC
  Filled 2018-03-19: qty 1

## 2018-03-19 MED ORDER — SODIUM CHLORIDE 0.9 % IV SOLN
2.0000 g | INTRAVENOUS | Status: DC
  Administered 2018-03-19 – 2018-03-21 (×3): 2 g via INTRAVENOUS
  Filled 2018-03-19 (×3): qty 2

## 2018-03-19 MED ORDER — ONDANSETRON HCL 4 MG/2ML IJ SOLN
4.0000 mg | Freq: Once | INTRAMUSCULAR | Status: AC
  Administered 2018-03-19: 4 mg via INTRAVENOUS
  Filled 2018-03-19: qty 2

## 2018-03-19 NOTE — Consult Note (Signed)
Subjective:   CC: abdominal pain  HPI:  Tracie Hanson is a 27 y.o. female who is consulted by Manson Passey for evaluation of above cc.  Symptoms were first noted 7 hours ago. Pain is sharp,  Associated with nausea and vomiting, exacerbated by nothing specific.  First time having this type of pain but was told in past that she may have gallbladder issues.  Workup in ED noted inflammation around GB concerning for acute cholecystitis.  No other obvious etiology for the pain.  Pt currently resides in a group home secondary to psychiatric issues.  Has an appointed guardian.       Past Medical History:  has a past medical history of Bipolar 1 disorder (HCC), Diabetes mellitus without complication (HCC), Hypertension, Intellectual disability, Personality disorder (HCC), and Schizoaffective disorder (HCC).  Past Surgical History: none reported  Family History: reviewed and not relevant to CC  Social History:  reports that she has never smoked. She has never used smokeless tobacco. She reports that she does not drink alcohol. Her drug history is not on file.  Current Medications: pending list from group home  Allergies:  Allergies as of 03/19/2018 - Review Complete 03/19/2018  Allergen Reaction Noted  . Klonopin [clonazepam] Other (See Comments) 01/07/2018  . Other Hives 01/07/2018    ROS:  A 15 point review of systems was performed and pertinent positives and negatives noted in HPI    Objective:     BP (!) 173/132 (BP Location: Right Arm)   Pulse 97   Temp 98.1 F (36.7 C) (Oral)   Resp 20   Ht 5\' 2"  (1.575 m)   Wt 122.5 kg   SpO2 98%   BMI 49.38 kg/m    Constitutional :  alert, cooperative, appears stated age and no distress  Lymphatics/Throat:  no asymmetry, masses, or scars  Respiratory:  clear to auscultation bilaterally  Cardiovascular:  regular rate and rhythm  Gastrointestinal: soft, no guarding, but states some tenderness in epigastric region, none in RUQ.    Musculoskeletal: Steady gait and movement  Skin: Cool and moist  Psychiatric: Normal affect, non-agitated, not confused       LABS:  CMP Latest Ref Rng & Units 03/19/2018 03/11/2018 01/07/2018  Glucose 70 - 99 mg/dL 811(B) 147(W) 295(A)  BUN 6 - 20 mg/dL 13 10 9   Creatinine 0.44 - 1.00 mg/dL 2.13 0.86 5.78  Sodium 135 - 145 mmol/L 139 138 139  Potassium 3.5 - 5.1 mmol/L 4.0 4.1 3.6  Chloride 98 - 111 mmol/L 103 103 103  CO2 22 - 32 mmol/L 27 27 25   Calcium 8.9 - 10.3 mg/dL 9.0 4.6(N) 8.9  Total Protein 6.5 - 8.1 g/dL 7.6 7.4 8.1  Total Bilirubin 0.3 - 1.2 mg/dL 0.4 0.3 0.4  Alkaline Phos 38 - 126 U/L 57 66 81  AST 15 - 41 U/L 30 28 72(H)  ALT 0 - 44 U/L 22 15 66(H)   CBC Latest Ref Rng & Units 03/19/2018 03/11/2018 01/07/2018  WBC 4.0 - 10.5 K/uL 10.9(H) 9.2 7.6  Hemoglobin 12.0 - 15.0 g/dL 10.3(L) 10.0(L) 11.9(L)  Hematocrit 36.0 - 46.0 % 32.6(L) 32.0(L) 35.1  Platelets 150 - 400 K/uL 368 338 360     RADS: CLINICAL DATA:  Right upper quadrant pain.  EXAM: ULTRASOUND ABDOMEN LIMITED RIGHT UPPER QUADRANT  COMPARISON:  Abdominopelvic CT earlier this day.  FINDINGS: Gallbladder:  Distended with multiple gallstones including an 11 mm stone in the gallbladder neck. Gallbladder wall thickening with wall thickness of  5 mm. Small amount pericholecystic fluid. Sonographic Murphy sign cannot be assessed given patient premedication.  Common bile duct:  Diameter: 3 mm, normal.  Liver:  No focal lesion identified. Within normal limits in parenchymal echogenicity. Portal vein is patent on color Doppler imaging with normal direction of blood flow towards the liver.  IMPRESSION: Sonographic findings consistent with acute cholecystitis. No biliary dilatation.   Electronically Signed   By: Narda RutherfordMelanie  Sanford M.D.   On: 03/19/2018 06:36  CLINICAL DATA:  Acute abdominal pain.  EXAM: CT ABDOMEN AND PELVIS WITH CONTRAST  TECHNIQUE: Multidetector CT imaging  of the abdomen and pelvis was performed using the standard protocol following bolus administration of intravenous contrast.  CONTRAST:  125mL OMNIPAQUE IOHEXOL 300 MG/ML  SOLN  COMPARISON:  None.  FINDINGS: Lower chest: The lung bases are clear.  Hepatobiliary: Prominent size liver with elongated left lobe. Gallstones including stone in the gallbladder neck. Mild gallbladder wall thickening and pericholecystic stranding. Upper normal common bile duct measuring 6 mm. No visualized choledocholithiasis.  Pancreas: No ductal dilatation or inflammation.  Spleen: Normal in size without focal abnormality.  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 Normal left adrenal gland. No hydronephrosis or perinephric edema. Homogeneous renal enhancement. Subcentimeter low-density in the left mid kidney is too small to characterize but likely cyst. Urinary bladder is nondistended.  Stomach/Bowel: Stomach is within normal limits. No evidence of bowel wall thickening, distention, or inflammatory changes. Cecum is low-lying in the central pelvis. Normal appendix located in the midline pelvis.  Vascular/Lymphatic: No significant vascular findings are present. No enlarged abdominal or pelvic lymph nodes.  Reproductive: Uterus and bilateral adnexa are unremarkable.  Other: No free air, free fluid, or intra-abdominal fluid collection.  Musculoskeletal: There are no acute or suspicious osseous abnormalities.  IMPRESSION: 1. Cholelithiasis including a stone in the gallbladder neck. Mild gallbladder wall thickening and pericholecystic stranding. Findings highly suspicious for acute cholecystitis. 2. Upper normal common bile duct measuring 6 mm. 3. Incidental right adrenal myelolipoma.   Electronically Signed   By: Narda RutherfordMelanie  Sanford M.D.   On: 03/19/2018 05:23 Assessment:      Abdominal pain, imaging consistent with acute cholecystitis  Morbid  obesity  Plan:      Discussed the risk of surgery including post-op infxn, seroma, biloma, chronic pain, poor-delayed wound healing, retained gallstone, conversion to open procedure, post-op SBO or ileus, and need for additional procedures to address said risks.  The risks of general anesthetic including MI, CVA, sudden death or even reaction to anesthetic medications also discussed. Alternatives include continued observation.  Benefits include possible symptom relief, prevention of complications including acute cholecystitis, pancreatitis.  Typical post operative recovery of 3-5 days rest, continued pain in area and incision sites, possible loose stools up to 4-6 weeks, also discussed.  Pt verbalized understanding and requested we talk with guardian to obtain consent.  She will like to proceed with surgery during this admission.  Initial attempt to reach guardian unsuccessful.  Will continue to try to obtain consent.    IV abx, NPO, IVF, admit to floor in the meantime.

## 2018-03-19 NOTE — ED Notes (Signed)
Spoke with Lubrizol Corporationyeisha hewett via phone to get consent for gall bladder surgery , consent has been obtained and and checked with charge rn .  [phone] cell  email [email or web address]

## 2018-03-19 NOTE — ED Provider Notes (Signed)
[facility] Emergency Department Provider Note   First MD Initiated Contact with Patient 03/19/18 [phone]     (approximate)  I have reviewed the triage vital signs and the nursing notes.  Level 5 caveat: History limited secondary to patient is a poor historian. HISTORY  Chief Complaint Abdominal Pain   HPI Tracie Hanson is a 27 y.o. female with below list of chronic medical conditions including diabetes hypertension bipolar schizoaffective disorder presents to the emergency department with acute onset of 10 out of 10 denies abdominal discomfort with associated vomiting with onset midnight tonight.  Patient denies any fever afebrile on presentation.   Past Medical History:  Diagnosis Date  . Bipolar 1 disorder (HCC)   . Diabetes mellitus without complication (HCC)   . Hypertension   . Intellectual disability   . Personality disorder (HCC)   . Schizoaffective disorder [facility](HCC)     Patient Active Problem List   Diagnosis Date Noted  . Diabetes mellitus without complication (HCC) 01/08/2018  . Intellectual disability 01/08/2018  . Hypertension 01/08/2018  . Malingering 01/08/2018    Past surgical history None  Prior to Admission medications   Not on File    Allergies Klonopin [clonazepam] and Other  History reviewed. No pertinent family history.  Social History Social History   Tobacco Use  . Smoking status: Never Smoker  . Smokeless tobacco: Never Used  Substance Use Topics  . Alcohol use: Never    Frequency: Never  . Drug use: Not on file    Review of Systems Constitutional: No fever/chills Eyes: No visual changes. ENT: No sore throat. Cardiovascular: Denies chest pain. Respiratory: Denies shortness of breath. Gastrointestinal: Positive for abdominal pain and vomiting.  No diarrhea.  No constipation. Genitourinary: Negative for dysuria. Musculoskeletal: Negative for neck pain.  Negative for back pain. Integumentary: Negative for  rash. Neurological: Negative for headaches, focal weakness or numbness.   ____________________________________________   PHYSICAL EXAM:  VITAL SIGNS: ED Triage Vitals  Enc Vitals Group     BP 03/19/18 0405 (!) 173/132     Pulse Rate 03/19/18 0405 97     Resp 03/19/18 0405 20     Temp 03/19/18 0405 98.1 F (36.7 C)     Temp Source 03/19/18 0405 Oral     SpO2 03/19/18 0405 98 %     Weight 03/19/18 0406 122.5 kg (270 lb)     Height 03/19/18 0406 1.575 m (5\' 2" )     Head Circumference --      Peak Flow --      Pain Score --      Pain Loc --      Pain Edu? --      Excl. in GC? --     Constitutional: Alert and oriented.  Apparent discomfort  eyes: Conjunctivae are normal.  Head: Atraumatic. Mouth/Throat: Mucous membranes are moist.  Oropharynx non-erythematous. Neck: No stridor.  No cervical spine tenderness to palpation. Cardiovascular: Normal rate, regular rhythm. Good peripheral circulation. Grossly normal heart sounds. Respiratory: Normal respiratory effort.  No retractions. Lungs CTAB. Gastrointestinal: Right upper quadrant/epigastric/left upper quadrant tenderness to palpation.  No distention.  Musculoskeletal: No lower extremity tenderness nor edema. No gross deformities of extremities. Neurologic:  Normal speech and language. No gross focal neurologic deficits are appreciated.  Skin:  Skin is warm, dry and intact. No rash noted. Psychiatric: Mood and affect are normal. Speech and behavior are normal.  ____________________________________________   LABS (all labs ordered are listed, but only abnormal  results are displayed)  Labs Reviewed  CBC WITH DIFFERENTIAL/PLATELET - Abnormal; Notable for the following components:      Result Value   WBC 10.9 (*)    RBC 3.72 (*)    Hemoglobin 10.3 (*)    HCT 32.6 (*)    Monocytes Absolute 1.3 (*)    All other components within normal limits  COMPREHENSIVE METABOLIC PANEL - Abnormal; Notable for the following components:    Glucose, Bld 127 (*)    Albumin 3.3 (*)    All other components within normal limits  URINALYSIS, COMPLETE (UACMP) WITH MICROSCOPIC - Abnormal; Notable for the following components:   Color, Urine YELLOW (*)    APPearance CLEAR (*)    Ketones, ur 5 (*)    Protein, ur 100 (*)    Leukocytes, UA TRACE (*)    All other components within normal limits  LIPASE, BLOOD  POC URINE PREG, ED    RADIOLOGY I,  N Nicolina Hirt, personally viewed and evaluated these images (plain radiographs) as part of my medical decision making, as well as reviewing the written report by the radiologist.  ED MD interpretation: Cholelithiasis with stone in the gallbladder neck with pericholecystic fluid noted on CT scan concerning for acute cholecystitis per radiologist.  Official radiology report(s): Ct Abdomen Pelvis W Contrast  Result Date: 03/19/2018 CLINICAL DATA:  Acute abdominal pain. EXAM: CT ABDOMEN AND PELVIS WITH CONTRAST TECHNIQUE: Multidetector CT imaging of the abdomen and pelvis was performed using the standard protocol following bolus administration of intravenous contrast. CONTRAST:  OMNIPAQUE IOHEXOL 300 MG/ML  SOLN COMPARISON:  None. FINDINGS: Lower chest: The lung bases are clear. Hepatobiliary: Prominent size liver with elongated left lobe. Gallstones including stone in the gallbladder neck. Mild gallbladder wall thickening and pericholecystic stranding. Upper normal common bile duct measuring 6 mm. No visualized choledocholithiasis. Pancreas: No ductal dilatation or inflammation. Spleen: Normal in size without focal abnormality. 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 Normal left adrenal gland. No hydronephrosis or perinephric edema. Homogeneous renal enhancement. Subcentimeter low-density in the left mid kidney is too small to characterize but likely cyst. Urinary bladder is nondistended. Stomach/Bowel: Stomach is within normal limits. No evidence of bowel wall  thickening, distention, or inflammatory changes. Cecum is low-lying in the central pelvis. Normal appendix located in the midline pelvis. Vascular/Lymphatic: No significant vascular findings are present. No enlarged abdominal or pelvic lymph nodes. Reproductive: Uterus and bilateral adnexa are unremarkable. Other: No free air, free fluid, or intra-abdominal fluid collection. Musculoskeletal: There are no acute or suspicious osseous abnormalities. IMPRESSION: 1. Cholelithiasis including a stone in the gallbladder neck. Mild gallbladder wall thickening and pericholecystic stranding. Findings highly suspicious for acute cholecystitis. 2. Upper normal common bile duct measuring 6 mm. 3. Incidental right adrenal myelolipoma. Electronically Signed   By: Narda Rutherford M.D.   On: 03/19/2018 05:23      Procedures   ____________________________________________   INITIAL IMPRESSION / ASSESSMENT AND PLAN / ED COURSE  As part of my medical decision making, I reviewed the following data within the electronic MEDICAL RECORD NUMBER  27 year old female presenting with above-stated history and physical exam abdominal discomfort CT scan revealed evidence of cholelithiasis with concern for possible cholecystitis based on findings.  Ultrasound performed consistent with the same as such patient discussed with Dr. Tonna Boehringer for hospital admission further evaluation and management.   ____________________________________________  FINAL CLINICAL IMPRESSION(S) / ED DIAGNOSES  Final diagnoses:  RUQ pain  Acute cholecystitis  MEDICATIONS GIVEN DURING THIS VISIT:  Medications  morphine 2 MG/ML injection 2 mg (2 mg Intravenous Given 03/19/18 0449)  ondansetron (ZOFRAN) injection 4 mg (4 mg Intravenous Given 03/19/18 0449)  iohexol (OMNIPAQUE) 300 MG/ML solution 125 mL (125 mLs Intravenous Contrast Given 03/19/18 0458)     ED Discharge Orders    None       Note:  This document was prepared using Dragon voice  recognition software and may include unintentional dictation errors.    Darci Current, MD 03/19/18 [phone]

## 2018-03-19 NOTE — ED Notes (Signed)
Spoke with pts legal guardian, Hermelinda Medicusyeisha Hewett, and received verbal consent for patient to have gallbladder removed.

## 2018-03-19 NOTE — Progress Notes (Signed)
03/19/2018  Subjective: Patient admitted this morning with concerns for cholecystitis.  Her BMI is 49.38.  At this point, she is feeling much better with only some discomfort.  Denies any nausea.  Vital signs: Temp:  [98.1 F (36.7 C)-98.2 F (36.8 C)] 98.2 F (36.8 C) (11/29 1206) Pulse Rate:  [83-97] 83 (11/29 1206) Resp:  [16-20] 20 (11/29 1206) BP: (115-173)/(65-132) 116/72 (11/29 1206) SpO2:  [96 %-99 %] 96 % (11/29 1206) Weight:  [122.5 kg] 122.5 kg (11/29 0406)   Intake/Output: No intake/output data recorded. Last BM Date: 03/18/18  Physical Exam: Constitutional:  No acute distress Abdomen:  Soft, obese, nondistended, with only some mild tenderness to palpation in the upper abdomen.  No peritonitis and Negative Murphy's sign.  Labs:  Recent Labs    03/19/18 0409  WBC 10.9*  HGB 10.3*  HCT 32.6*  PLT 368   Recent Labs    03/19/18 0409  NA 139  K 4.0  CL 103  CO2 27  GLUCOSE 127*  BUN 13  CREATININE 0.85  CALCIUM 9.0   No results for input(s): LABPROT, INR in the last 72 hours.  Imaging: Ct Abdomen Pelvis W Contrast  Result Date: 03/19/2018 CLINICAL DATA:  Acute abdominal pain. EXAM: CT ABDOMEN AND PELVIS WITH CONTRAST TECHNIQUE: Multidetector CT imaging of the abdomen and pelvis was performed using the standard protocol following bolus administration of intravenous contrast. CONTRAST:  125mL OMNIPAQUE IOHEXOL 300 MG/ML  SOLN COMPARISON:  None. FINDINGS: Lower chest: The lung bases are clear. Hepatobiliary: Prominent size liver with elongated left lobe. Gallstones including stone in the gallbladder neck. Mild gallbladder wall thickening and pericholecystic stranding. Upper normal common bile duct measuring 6 mm. No visualized choledocholithiasis. Pancreas: No ductal dilatation or inflammation. Spleen: Normal in size without focal abnormality. 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 Normal left adrenal gland. No hydronephrosis or  perinephric edema. Homogeneous renal enhancement. Subcentimeter low-density in the left mid kidney is too small to characterize but likely cyst. Urinary bladder is nondistended. Stomach/Bowel: Stomach is within normal limits. No evidence of bowel wall thickening, distention, or inflammatory changes. Cecum is low-lying in the central pelvis. Normal appendix located in the midline pelvis. Vascular/Lymphatic: No significant vascular findings are present. No enlarged abdominal or pelvic lymph nodes. Reproductive: Uterus and bilateral adnexa are unremarkable. Other: No free air, free fluid, or intra-abdominal fluid collection. Musculoskeletal: There are no acute or suspicious osseous abnormalities. IMPRESSION: 1. Cholelithiasis including a stone in the gallbladder neck. Mild gallbladder wall thickening and pericholecystic stranding. Findings highly suspicious for acute cholecystitis. 2. Upper normal common bile duct measuring 6 mm. 3. Incidental right adrenal myelolipoma. Electronically Signed   By: Narda RutherfordMelanie  Sanford M.D.   On: 03/19/2018 05:23   [place] Abdomen Limited Ruq  Result Date: 03/19/2018 CLINICAL DATA:  Right upper quadrant pain. EXAM: ULTRASOUND ABDOMEN LIMITED RIGHT UPPER QUADRANT COMPARISON:  Abdominopelvic CT earlier this day. FINDINGS: Gallbladder: Distended with multiple gallstones including an 11 mm stone in the gallbladder neck. Gallbladder wall thickening with wall thickness of 5 mm. Small amount pericholecystic fluid. Sonographic Murphy sign cannot be assessed given patient premedication. Common bile duct: Diameter: 3 mm, normal. Liver: No focal lesion identified. Within normal limits in parenchymal echogenicity. Portal vein is patent on color Doppler imaging with normal direction of blood flow towards the liver. IMPRESSION: Sonographic findings consistent with acute cholecystitis. No biliary dilatation. Electronically Signed   By: Narda RutherfordMelanie  Sanford M.D.   On: 03/19/2018 06:36  Assessment/Plan: This is a 27 y.o. female with cholecystitis.  --discussed with patient that at this point we would want to treat this conservatively with IV antibiotics and bowel rest and see how her cholecystitis progresses.  If it improves, then we can plan for outpatient cholecystectomy with Dr. Tonna Boehringer, but if there is worsening, we would go to OR during this admission.  She's feeling better, which is reassuring. --may start her on clears tonight for dinner depending on her abdominal exam.   Howie Ill, MD [facility]

## 2018-03-19 NOTE — ED Notes (Signed)
POC PREG NEG 

## 2018-03-19 NOTE — Care Management Note (Signed)
Case Management Note  Patient Details  Name: Tracie Hanson MRN: [id number] Date of Birth: [birth year]  Subjective/Objective:   Patient admitted to [facility] under observation status for acute cholecystitis. RNCM consulted on patient to provide MOON letter and complete assessment. [facility] is from [facility]. Has multiple admissions related to bipolar and schizoaffective disorders. Patient has legal guardian Hulan Saasyeisha Hewitt [phone]. Patient is independent with activities of daily living. Uses no DME. Surgeon consulted to weigh options of surgery vs conservative management with IV abx.                   Action/Plan: RNCM to continue to follow for any needs and update legal guardian for plan of care.   Expected Discharge Date:                  Expected Discharge Plan:     In-House Referral:     Discharge planning Services     Post Acute Care Choice:    Choice offered to:     DME Arranged:    DME Agency:     HH Arranged:    HH Agency:     Status of Service:     If discussed at [employer] Meetings, dates discussed:    Additional Comments:  Edwinna AreolaJosh A Placido Hangartner, RN 03/19/2018, 9:20 AM

## 2018-03-19 NOTE — Care Management Obs Status (Signed)
MEDICARE OBSERVATION STATUS NOTIFICATION   Patient Details  Name: Tracie Hanson MRN: [id number] Date of Birth: 1990-05-08   Medicare Observation Status Notification Given:  Yes    Abdulahad Mederos A Maecie Sevcik, RN 03/19/2018, 9:18 AM

## 2018-03-19 NOTE — ED Triage Notes (Signed)
Pt here from [facility] .with c/o abd pain .stareting around midnight , pt a/o X 3

## 2018-03-19 NOTE — ED Notes (Signed)
Unable to contact pt guardian at this time , left message with group home and made them aware that we need consent for surgery .

## 2018-03-19 NOTE — ED Notes (Signed)
John RN, aware of bed assigned  

## 2018-03-20 DIAGNOSIS — K810 Acute cholecystitis: Secondary | ICD-10-CM | POA: Diagnosis not present

## 2018-03-20 LAB — COMPREHENSIVE METABOLIC PANEL
ALK PHOS: 47 U/L (ref 38–126)
ALT: 18 U/L (ref 0–44)
AST: 21 U/L (ref 15–41)
Albumin: 2.8 g/dL — ABNORMAL LOW (ref 3.5–5.0)
Anion gap: 13 (ref 5–15)
BUN: 7 mg/dL (ref 6–20)
CALCIUM: 8.3 mg/dL — AB (ref 8.9–10.3)
CO2: 26 mmol/L (ref 22–32)
Chloride: 100 mmol/L (ref 98–111)
Creatinine, Ser: 0.78 mg/dL (ref 0.44–1.00)
GFR calc non Af Amer: >60 mL/min (ref >60)
Glucose, Bld: 105 mg/dL — ABNORMAL HIGH (ref 70–99)
Potassium: 3.6 mmol/L (ref 3.5–5.1)
Sodium: 139 mmol/L (ref 135–145)
Total Bilirubin: 0.6 mg/dL (ref 0.3–1.2)
Total Protein: 6.4 g/dL — ABNORMAL LOW (ref 6.5–8.1)

## 2018-03-20 LAB — GLUCOSE, CAPILLARY
Glucose-Capillary: 99 mg/dL (ref 70–99)
Glucose-Capillary: 109 mg/dL — ABNORMAL HIGH (ref 70–99)
Glucose-Capillary: 94 mg/dL (ref 70–99)
Glucose-Capillary: 88 mg/dL (ref 70–99)

## 2018-03-20 LAB — CBC WITH DIFFERENTIAL/PLATELET
Abs Immature Granulocytes: 0.04 10*3/uL (ref 0.00–0.07)
Basophils Absolute: 0 10*3/uL (ref 0.0–0.1)
Basophils Relative: 0 %
EOS PCT: 1 %
Eosinophils Absolute: 0.1 10*3/uL (ref 0.0–0.5)
HCT: 31.3 % — ABNORMAL LOW (ref 36.0–46.0)
Hemoglobin: 9.7 g/dL — ABNORMAL LOW (ref 12.0–15.0)
Immature Granulocytes: 1 %
LYMPHS PCT: 44 %
Lymphs Abs: 2.9 10*3/uL (ref 0.7–4.0)
MCH: 27.6 pg (ref 26.0–34.0)
MCHC: 31 g/dL (ref 30.0–36.0)
MCV: 89.2 fL (ref 80.0–100.0)
MONO ABS: 0.7 10*3/uL (ref 0.1–1.0)
Monocytes Relative: 11 %
Neutro Abs: 2.8 10*3/uL (ref 1.7–7.7)
Neutrophils Relative %: 43 %
Platelets: 293 10*3/uL (ref 150–400)
RBC: 3.51 MIL/uL — ABNORMAL LOW (ref 3.87–5.11)
RDW: 14.6 % (ref 11.5–15.5)
WBC: 6.5 10*3/uL (ref 4.0–10.5)
nRBC: 0 % (ref 0.0–0.2)

## 2018-03-20 LAB — HIV ANTIBODY (ROUTINE TESTING W REFLEX): HIV Screen 4th Generation wRfx: NONREACTIVE

## 2018-03-20 MED ORDER — DIVALPROEX SODIUM 500 MG PO DR TAB
1500.0000 mg | DELAYED_RELEASE_TABLET | Freq: Two times a day (BID) | ORAL | Status: DC
  Administered 2018-03-21 – 2018-03-22 (×2): 1500 mg via ORAL
  Filled 2018-03-20 (×4): qty 3

## 2018-03-20 NOTE — Progress Notes (Signed)
03/20/2018  Subjective: No acute events.  Denies any pain.  No nausea, or fevers.  WBC normalized.  Vital signs: Temp:  [98.1 F (36.7 C)-98.6 F (37 C)] 98.3 F (36.8 C) (11/30 0408) Pulse Rate:  [80-83] 80 (11/30 0408) Resp:  [12-20] 16 (11/30 0408) BP: (109-117)/(60-72) 114/63 (11/30 0408) SpO2:  [96 %-99 %] 98 % (11/30 0408)   Intake/Output: 11/29 0701 - 11/30 0700 In: 1794 [I.V.:1697.4; IV Piggyback:96.6] Out: 1150 [Urine:1150] Last BM Date: 03/18/18  Physical Exam: Constitutional: No acute distress Abdomen:  Soft, obese, nondistended, nontender to palpation.  Labs:  Recent Labs    03/19/18 0409 03/20/18 0546  WBC 10.9* 6.5  HGB 10.3* 9.7*  HCT 32.6* 31.3*  PLT 368 293   Recent Labs    03/19/18 0409 03/20/18 0546  NA 139 139  K 4.0 3.6  CL 103 100  CO2 27 26  GLUCOSE 127* 105*  BUN 13 7  CREATININE 0.85 0.78  CALCIUM 9.0 8.3*   No results for input(s): LABPROT, INR in the last 72 hours.  Imaging: No results found.  Assessment/Plan: This is a 27 y.o. female with cholecystitis  --will start clear liquid diet today and advance as tolerated. --possible d/c home tomorrow.   Howie IllJose Luis Rydge Texidor, MD [facility]

## 2018-03-20 NOTE — Clinical Social Work Note (Signed)
The CSW was able to reach a representative for Tracie Hanson Tracie Hanson. The patient can return tomorrow if stable. CSW will enter full assessment when able.  Tracie PonderKaren Hanson Tracie Hanson, MSW, Theresia MajorsLCSWA [phone]

## 2018-03-21 DIAGNOSIS — K810 Acute cholecystitis: Secondary | ICD-10-CM | POA: Diagnosis not present

## 2018-03-21 LAB — GLUCOSE, CAPILLARY
Glucose-Capillary: 104 mg/dL — ABNORMAL HIGH (ref 70–99)
Glucose-Capillary: 157 mg/dL — ABNORMAL HIGH (ref 70–99)
Glucose-Capillary: 102 mg/dL — ABNORMAL HIGH (ref 70–99)
Glucose-Capillary: 148 mg/dL — ABNORMAL HIGH (ref 70–99)

## 2018-03-21 MED ORDER — AMOXICILLIN-POT CLAVULANATE 875-125 MG PO TABS: 1.0000 | ORAL_TABLET | Freq: Two times a day (BID) | ORAL | 0 refills | Status: DC

## 2018-03-21 MED ORDER — HYDROCODONE-ACETAMINOPHEN 5-325 MG PO TABS: 1.0000 | ORAL_TABLET | Freq: Four times a day (QID) | ORAL | 0 refills | Status: AC | PRN

## 2018-03-21 MED ORDER — SODIUM CHLORIDE 0.9 % IV SOLN
INTRAVENOUS | Status: DC | PRN
  Administered 2018-03-21: 250 mL via INTRAVENOUS

## 2018-03-21 MED ORDER — AMOXICILLIN-POT CLAVULANATE 875-125 MG PO TABS
1.0000 | ORAL_TABLET | Freq: Two times a day (BID) | ORAL | Status: DC
  Administered 2018-03-21 – 2018-03-22 (×3): 1 via ORAL
  Filled 2018-03-21 (×3): qty 1

## 2018-03-21 NOTE — Clinical Social Work Note (Signed)
Clinical Social Work Assessment  Patient Details  Name: Tracie Hanson MRN: [id number] Date of Birth: 1991-01-08  Date of referral:  03/21/18               Reason for consult:  Facility Placement                Permission sought to share information with:  Oceanographeracility Contact Representative Permission granted to share information::  Yes, Verbal Permission Granted  Name::        Agency::  [facility]  Relationship::     Contact Information:     Housing/Transportation Living arrangements for the past 2 months:  Group Home Source of Information:  Patient, Medical Team, Facility, Guardian Patient Interpreter Needed:  None Criminal Activity/Legal Involvement Pertinent to Current Situation/Hospitalization:  No - Comment as needed Significant Relationships:  Merchandiser, retailCommunity Support, Mental Health Provider Lives with:  Facility Resident Do you feel safe going back to the place where you live?  Yes Need for family participation in patient care:  Yes (Comment)(Patient has a legal guardian)  Care giving concerns:  Patient is a resident of a group home and has a legal guardian.   Social Worker assessment / plan:  The patient contacted the patient's group home representative, Tracie Hanson, to discuss discharge planning. The CSW advised Tracie Hanson that the patient will need to schedule outpatient surgery with Dr. Tonna BoehringerSakai and will be discharged with PO antibiotics. The CSW explained the [facility] to [facility]. [facility]ie Hanson confirmed that she could pick up the patient this afternoon once she is cleared medically.  The CSW [facility] on[facility] of the discharge and need to schedule surgery. CSW is signing off. Please consult should needs arise.  Employment status:  Disabled (Comment on whether or not currently receiving Disability) Insurance information:  Medicare, [place] PT Recommendations:  Not assessed at this time Information / Referral to community resources:     Patient/Family's  Response to care:  The facility representative thanked the CSW for assistance and explanation of needs.  Patient/Family's Understanding of and Emotional Response to Diagnosis, Current Treatment, and Prognosis:  The patient, facility, and legal guardian are all in agreement with the patient's return to [facility].  Emotional Assessment Appearance:  Appears stated age Attitude/Demeanor/Rapport:  Engaged Affect (typically observed):  Stable Orientation:  Oriented to Self, Oriented to Place, Oriented to  Time, Oriented to Situation Alcohol / Substance use:  Never Used Psych involvement (Current and /or in the community):  Yes (Comment), Outpatient Provider  Discharge Needs  Concerns to be addressed:  Care Coordination, Discharge Planning Concerns Readmission within the last 30 days:  No Current discharge risk:  Chronically ill, Psychiatric Illness Barriers to Discharge:  No Barriers Identified   Judi CongKaren M Cylah Fannin, LCSW 03/21/2018, 11:11 AM

## 2018-03-21 NOTE — Progress Notes (Signed)
Care giver called concerning pt discharge, no answer.

## 2018-03-21 NOTE — Discharge Summary (Signed)
Patient ID: Tracie Hanson MRN: [id number] DOB/AGE: [birth year] 27 y.o.  Admit date: 03/19/2018 Discharge date: 03/21/2018   Discharge Diagnoses:  Active Problems:   Acute cholecystitis   Procedures:  None  Hospital Course: Patient was admitted on 11/29 with acute cholecystitis.  This was managed conservatively.  She was started on IV antibiotics and was NPO.  Her diet was slowly advanced as her pain resolved.  She was transitioned to oral antibiotic.  Her pain was resolved, she was tolerating a diet, denies any nausea, and is ready for discharge.  She will follow up with Dr. Tonna BoehringerSakai for surgery planning for cholecystectomy  Consults: None  Disposition:  Group home  Discharge Instructions    Call MD for:  difficulty breathing, headache or visual disturbances   Complete by:  As directed    Call MD for:  persistant nausea and vomiting   Complete by:  As directed    Call MD for:  severe uncontrolled pain   Complete by:  As directed    Call MD for:  temperature >100.4   Complete by:  As directed    Diet - low sodium heart healthy   Complete by:  As directed    Discharge instructions   Complete by:  As directed    1.  Continue a low fat diet to decrease the stimulation of the gallbladder. 2.  Complete antibiotic course 3.  Call MD or come back to ED if worsening pain, nausea, vomiting, fevers, or chills.   Driving Restrictions   Complete by:  As directed    Do not drive while taking narcotics for pain control.   Increase activity slowly   Complete by:  As directed      Allergies as of 03/21/2018      Reactions   Klonopin [clonazepam] Other (See Comments)   Seizures   Other Hives   Celery      Medication List    TAKE these medications   acetaminophen 650 MG CR tablet Commonly known as:  TYLENOL Take 650 mg by mouth every 8 (eight) hours as needed for pain.   albuterol 108 (90 Base) MCG/ACT inhaler Commonly known as:  PROVENTIL HFA;VENTOLIN HFA Inhale 2 puffs into the  lungs every 6 (six) hours as needed for wheezing or shortness of breath.   alum & mag hydroxide-simeth 200-200-20 MG/5ML suspension Commonly known as:  MAALOX/MYLANTA Take 30 mLs by mouth every 6 (six) hours as needed for indigestion or heartburn.   amoxicillin-clavulanate 875-125 MG tablet Commonly known as:  AUGMENTIN Take 1 tablet by mouth every 12 (twelve) hours.   BALCOLTRA 0.1-20 MG-MCG(21) Tabs Generic drug:  Levonorgest-Eth Estrad-Fe Bisg Take 1 tablet by mouth daily.   budesonide-formoterol 160-4.5 MCG/ACT inhaler Commonly known as:  SYMBICORT Inhale 2 puffs into the lungs 2 (two) times daily.   divalproex 500 MG DR tablet Commonly known as:  DEPAKOTE Take 1,500 mg by mouth 2 (two) times daily.   divalproex 500 MG DR tablet Commonly known as:  DEPAKOTE Take 2,000 mg by mouth at bedtime.   hydrochlorothiazide 25 MG tablet Commonly known as:  HYDRODIURIL Take 12.5 mg by mouth daily.   HYDROcodone-acetaminophen 5-325 MG tablet Commonly known as:  NORCO/VICODIN Take 1 tablet by mouth every 6 (six) hours as needed for severe pain.   hydrOXYzine 25 MG capsule Commonly known as:  VISTARIL Take 25 mg by mouth 3 (three) times daily as needed.   hydrOXYzine 25 MG tablet Commonly known as:  ATARAX/VISTARIL Take 25  mg by mouth every 6 (six) hours as needed.   ibuprofen 400 MG tablet Commonly known as:  ADVIL,MOTRIN Take 400 mg by mouth every 6 (six) hours as needed for moderate pain.   INVEGA SUSTENNA 234 MG/1.5ML Susy injection Generic drug:  paliperidone Inject 234 mg into the muscle every 30 (thirty) days.   lisinopril 40 MG tablet Commonly known as:  PRINIVIL,ZESTRIL Take 40 mg by mouth daily.   Melatonin 3 MG Tabs Take 6 mg by mouth at bedtime.   metFORMIN 1000 MG tablet Commonly known as:  GLUCOPHAGE Take 1,000 mg by mouth 2 (two) times daily with a meal.   omega-3 acid ethyl esters 1 g capsule Commonly known as:  LOVAZA Take 1 g by mouth 3 (three)  times daily.   polyethylene glycol packet Commonly known as:  MIRALAX / GLYCOLAX Take 17 g by mouth daily as needed for mild constipation.   prazosin 2 MG capsule Commonly known as:  MINIPRESS Take 4 mg by mouth at bedtime.   QUEtiapine 200 MG tablet Commonly known as:  SEROQUEL Take 200 mg by mouth at bedtime.   ranitidine 150 MG tablet Commonly known as:  ZANTAC Take 150 mg by mouth daily.      Follow-up Information    Sakai, Isami, DO Follow up in 1 week(s).   Specialty:  Surgery Contact information: [address] [place] [address] [phone]

## 2018-03-21 NOTE — Progress Notes (Signed)
03/21/2018  Subjective: No acute events.  Advanced first to clears and then full liquids.  Tolerating without any pain.  Denies any pain or nausea.  Vital signs: Temp:  [98.1 F (36.7 C)-98.5 F (36.9 C)] 98.1 F (36.7 C) (12/01 0602) Pulse Rate:  [78-80] 79 (12/01 0602) Resp:  [16-20] 16 (12/01 0602) BP: (101-142)/(54-82) 142/82 (12/01 0857) SpO2:  [95 %-97 %] 97 % (12/01 0602)   Intake/Output: 11/30 0701 - 12/01 0700 In: 849.1 [P.O.:600; I.V.:207.5; IV Piggyback:41.5] Out: 2200 [Urine:2200] Last BM Date: 03/20/18  Physical Exam: Constitutional: No acute distress Abdomen:  Soft, nondistended, obese, nontender to palpation.  Labs:  Recent Labs    03/19/18 0409 03/20/18 0546  WBC 10.9* 6.5  HGB 10.3* 9.7*  HCT 32.6* 31.3*  PLT 368 293   Recent Labs    03/19/18 0409 03/20/18 0546  NA 139 139  K 4.0 3.6  CL 103 100  CO2 27 26  GLUCOSE 127* 105*  BUN 13 7  CREATININE 0.85 0.78  CALCIUM 9.0 8.3*   No results for input(s): LABPROT, INR in the last 72 hours.  Imaging: No results found.  Assessment/Plan: This is a 27 y.o. female with acute cholecystitis.  --will advance to low fat diet today. --transition abx to po. --d/c home in afternoon if doing well.   Howie IllJose Luis Diem Pagnotta, MD [facility]

## 2018-03-21 NOTE — NC FL2 (Signed)
[place] MEDICAID FL2 LEVEL OF CARE SCREENING TOOL     IDENTIFICATION  Patient Name: Tracie Hanson Birthdate: 1990/10/07 Sex: female Admission Date (Current Location): 03/19/2018  [place] and [place] Number:  Chiropodist and Address:  [facility], [address] [place], [place] [address]      Provider Number: [id number]  Attending Physician Name and Address:  Sung Amabile, DO  Relative Name and Phone Number:       Current Level of Care: Hospital Recommended Level of Care: Other (Comment)(Group Home) Prior Approval Number:    Date Approved/Denied:   PASRR Number:    Discharge Plan: Other (Comment)(Group Home)    Current Diagnoses: Patient Active Problem List   Diagnosis Date Noted  . Acute cholecystitis 03/19/2018  . Diabetes mellitus without complication (HCC) 01/08/2018  . Intellectual disability 01/08/2018  . Hypertension 01/08/2018  . Malingering 01/08/2018    Orientation RESPIRATION BLADDER Height & Weight     Self, Time, Situation, Place  Normal Continent Weight: 270 lb (122.5 kg) Height:  5\' 2"  (157.5 cm)  BEHAVIORAL SYMPTOMS/MOOD NEUROLOGICAL BOWEL NUTRITION STATUS      Continent Diet(Heart healthy)  AMBULATORY STATUS COMMUNICATION OF NEEDS Skin   Independent Verbally Normal                       Personal Care Assistance Level of Assistance  Bathing, Feeding, Dressing Bathing Assistance: Independent Feeding assistance: Independent Dressing Assistance: Independent     Functional Limitations Info  Sight, Hearing, Speech Sight Info: Adequate Hearing Info: Adequate Speech Info: Adequate    SPECIAL CARE FACTORS FREQUENCY                       Contractures Contractures Info: Not present    Additional Factors Info  Code Status, Psychotropic, Allergies Code Status Info: Full Allergies Info: Klonopin Clonazepam, Other Psychotropic Info: Depakote, Vistaril         Current Medications  (03/21/2018):  This is the current hospital active medication list Current Facility-Administered Medications  Medication Dose Route Frequency Provider Last Rate Last Dose  . 0.9 %  sodium chloride infusion   Intravenous PRN Tonna Boehringer, Isami, DO 10 mL/hr at 03/21/18 0854 250 mL at 03/21/18 0854  . albuterol (PROVENTIL) (2.5 MG/3ML) 0.083% nebulizer solution 2.5 mg  2.5 mg Inhalation Q6H PRN Sakai, Isami, DO      . alum & mag hydroxide-simeth (MAALOX/MYLANTA) 200-200-20 MG/5ML suspension 30 mL  30 mL Oral Q6H PRN Sakai, Isami, DO      . amoxicillin-clavulanate (AUGMENTIN) 875-125 MG per tablet 1 tablet  1 tablet Oral Q12H Piscoya, Jose, MD      . divalproex (DEPAKOTE) DR tablet 1,500 mg  1,500 mg Oral BID Sakai, Isami, DO   1,500 mg at 03/21/18 0850  . divalproex (DEPAKOTE) DR tablet 2,000 mg  2,000 mg Oral QHS Sakai, Isami, DO   2,000 mg at 03/20/18 2234  . docusate sodium (COLACE) capsule 100 mg  100 mg Oral BID PRN Sakai, Isami, DO      . enoxaparin (LOVENOX) injection 40 mg  40 mg Subcutaneous Q24H Sakai, Isami, DO      . famotidine (PEPCID) tablet 20 mg  20 mg Oral Daily Sakai, Isami, DO   20 mg at 03/21/18 0857  . hydrochlorothiazide (HYDRODIURIL) tablet 12.5 mg  12.5 mg Oral Daily Sakai, Isami, DO   12.5 mg at 03/21/18 0857  . HYDROcodone-acetaminophen (NORCO/VICODIN) 5-325 MG per tablet 1-2  tablet  1-2 tablet Oral Q4H PRN Sung Amabile, DO   1 tablet at 03/19/18 1901  . hydrOXYzine (ATARAX/VISTARIL) tablet 25 mg  25 mg Oral Q6H PRN Sakai, Isami, DO      . insulin aspart (novoLOG) injection 0-20 Units  0-20 Units Subcutaneous TID WC Sakai, Isami, DO      . Levonorgest-Eth Estrad-Fe Bisg 0.1-20 MG-MCG(21) TABS 1 tablet  1 tablet Oral Daily Sakai, Isami, DO      . lisinopril (PRINIVIL,ZESTRIL) tablet 40 mg  40 mg Oral Daily Sakai, Isami, DO   40 mg at 03/21/18 0857  . Melatonin TABS 5 mg  1 tablet Oral QHS Sakai, Isami, DO   5 mg at 03/20/18 2234  . mometasone-formoterol (DULERA) 200-5 MCG/ACT  inhaler 2 puff  2 puff Inhalation BID Sakai, Isami, DO   2 puff at 03/21/18 0850  . morphine 2 MG/ML injection 2 mg  2 mg Intravenous Q4H PRN Sakai, Isami, DO      . omega-3 acid ethyl esters (LOVAZA) capsule 1 g  1 g Oral TID Sakai, Isami, DO      . ondansetron (ZOFRAN-ODT) disintegrating tablet 4 mg  4 mg Oral Q6H PRN Tonna Boehringer, Isami, DO       Or  . ondansetron (ZOFRAN) injection 4 mg  4 mg Intravenous Q6H PRN Sakai, Isami, DO      . polyethylene glycol (MIRALAX / GLYCOLAX) packet 17 g  17 g Oral Daily PRN Sakai, Isami, DO      . prazosin (MINIPRESS) capsule 4 mg  4 mg Oral QHS Sakai, Isami, DO   4 mg at 03/20/18 2233  . QUEtiapine (SEROQUEL) tablet 200 mg  200 mg Oral QHS Sakai, Isami, DO   200 mg at 03/20/18 2234  . traMADol (ULTRAM) tablet 50 mg  50 mg Oral Q6H PRN Sung Amabile, DO         Discharge Medications: TAKE these medications   acetaminophen 650 MG CR tablet Commonly known as:  TYLENOL Take 650 mg by mouth every 8 (eight) hours as needed for pain.   albuterol 108 (90 Base) MCG/ACT inhaler Commonly known as:  PROVENTIL HFA;VENTOLIN HFA Inhale 2 puffs into the lungs every 6 (six) hours as needed for wheezing or shortness of breath.   alum & mag hydroxide-simeth 200-200-20 MG/5ML suspension Commonly known as:  MAALOX/MYLANTA Take 30 mLs by mouth every 6 (six) hours as needed for indigestion or heartburn.   amoxicillin-clavulanate 875-125 MG tablet Commonly known as:  AUGMENTIN Take 1 tablet by mouth every 12 (twelve) hours.   BALCOLTRA 0.1-20 MG-MCG(21) Tabs Generic drug:  Levonorgest-Eth Estrad-Fe Bisg Take 1 tablet by mouth daily.   budesonide-formoterol 160-4.5 MCG/ACT inhaler Commonly known as:  SYMBICORT Inhale 2 puffs into the lungs 2 (two) times daily.   divalproex 500 MG DR tablet Commonly known as:  DEPAKOTE Take 1,500 mg by mouth 2 (two) times daily.   divalproex 500 MG DR tablet Commonly known as:  DEPAKOTE Take 2,000 mg by mouth at bedtime.    hydrochlorothiazide 25 MG tablet Commonly known as:  HYDRODIURIL Take 12.5 mg by mouth daily.   HYDROcodone-acetaminophen 5-325 MG tablet Commonly known as:  NORCO/VICODIN Take 1 tablet by mouth every 6 (six) hours as needed for severe pain.   hydrOXYzine 25 MG capsule Commonly known as:  VISTARIL Take 25 mg by mouth 3 (three) times daily as needed.   hydrOXYzine 25 MG tablet Commonly known as:  ATARAX/VISTARIL Take 25 mg by mouth every 6 (  six) hours as needed.   ibuprofen 400 MG tablet Commonly known as:  ADVIL,MOTRIN Take 400 mg by mouth every 6 (six) hours as needed for moderate pain.   INVEGA SUSTENNA 234 MG/1.5ML Susy injection Generic drug:  paliperidone Inject 234 mg into the muscle every 30 (thirty) days.   lisinopril 40 MG tablet Commonly known as:  PRINIVIL,ZESTRIL Take 40 mg by mouth daily.   Melatonin 3 MG Tabs Take 6 mg by mouth at bedtime.   metFORMIN 1000 MG tablet Commonly known as:  GLUCOPHAGE Take 1,000 mg by mouth 2 (two) times daily with a meal.   omega-3 acid ethyl esters 1 g capsule Commonly known as:  LOVAZA Take 1 g by mouth 3 (three) times daily.   polyethylene glycol packet Commonly known as:  MIRALAX / GLYCOLAX Take 17 g by mouth daily as needed for mild constipation.   prazosin 2 MG capsule Commonly known as:  MINIPRESS Take 4 mg by mouth at bedtime.   QUEtiapine 200 MG tablet Commonly known as:  SEROQUEL Take 200 mg by mouth at bedtime.   ranitidine 150 MG tablet Commonly known as:  ZANTAC Take 150 mg by mouth daily.       Relevant Imaging Results:  Relevant Lab Results:   Additional Information    Judi CongKaren M Dane Bloch, LCSW

## 2018-03-21 NOTE — Progress Notes (Signed)
Latoya from the group home pt lives at was called about pt.'s discharge. She said she will be here after 7 o'clock to pick pt up. Pt updated.   Jefferie Holston [place]

## 2018-03-22 DIAGNOSIS — Z6841 Body Mass Index (BMI) 40.0 and over, adult: Secondary | ICD-10-CM | POA: Diagnosis not present

## 2018-03-22 DIAGNOSIS — Z91018 Allergy to other foods: Secondary | ICD-10-CM | POA: Diagnosis not present

## 2018-03-22 DIAGNOSIS — Z888 Allergy status to other drugs, medicaments and biological substances status: Secondary | ICD-10-CM | POA: Diagnosis not present

## 2018-03-22 DIAGNOSIS — F79 Unspecified intellectual disabilities: Secondary | ICD-10-CM | POA: Diagnosis present

## 2018-03-22 DIAGNOSIS — K810 Acute cholecystitis: Secondary | ICD-10-CM | POA: Diagnosis present

## 2018-03-22 DIAGNOSIS — F250 Schizoaffective disorder, bipolar type: Secondary | ICD-10-CM | POA: Diagnosis present

## 2018-03-22 DIAGNOSIS — E119 Type 2 diabetes mellitus without complications: Secondary | ICD-10-CM | POA: Diagnosis present

## 2018-03-22 DIAGNOSIS — K8000 Calculus of gallbladder with acute cholecystitis without obstruction: Secondary | ICD-10-CM | POA: Diagnosis present

## 2018-03-22 DIAGNOSIS — F609 Personality disorder, unspecified: Secondary | ICD-10-CM | POA: Diagnosis present

## 2018-03-22 DIAGNOSIS — I10 Essential (primary) hypertension: Secondary | ICD-10-CM | POA: Diagnosis present

## 2018-03-22 LAB — GLUCOSE, CAPILLARY: Glucose-Capillary: 101 mg/dL — ABNORMAL HIGH (ref 70–99)

## 2018-03-22 NOTE — Progress Notes (Signed)
Tracie Hanson  A and O x 4. VSS. Pt tolerating diet well. No complaints of pain or nausea. IV removed intact, prescriptions given. Pt voiced understanding of discharge instructions with no further questions. Pt discharged via wheelchair with Tracie Hanson legal guardian.    Allergies as of 03/22/2018      Reactions   Klonopin [clonazepam] Other (See Comments)   Seizures   Other Hives   Celery      Medication List    TAKE these medications   acetaminophen 650 MG CR tablet Commonly known as:  TYLENOL Take 650 mg by mouth every 8 (eight) hours as needed for pain.   albuterol 108 (90 Base) MCG/ACT inhaler Commonly known as:  PROVENTIL HFA;VENTOLIN HFA Inhale 2 puffs into the lungs every 6 (six) hours as needed for wheezing or shortness of breath.   alum & mag hydroxide-simeth 200-200-20 MG/5ML suspension Commonly known as:  MAALOX/MYLANTA Take 30 mLs by mouth every 6 (six) hours as needed for indigestion or heartburn.   amoxicillin-clavulanate 875-125 MG tablet Commonly known as:  AUGMENTIN Take 1 tablet by mouth every 12 (twelve) hours.   BALCOLTRA 0.1-20 MG-MCG(21) Tabs Generic drug:  Levonorgest-Eth Estrad-Fe Bisg Take 1 tablet by mouth daily.   budesonide-formoterol 160-4.5 MCG/ACT inhaler Commonly known as:  SYMBICORT Inhale 2 puffs into the lungs 2 (two) times daily.   divalproex 500 MG DR tablet Commonly known as:  DEPAKOTE Take 1,500 mg by mouth 2 (two) times daily.   divalproex 500 MG DR tablet Commonly known as:  DEPAKOTE Take 2,000 mg by mouth at bedtime.   hydrochlorothiazide 25 MG tablet Commonly known as:  HYDRODIURIL Take 12.5 mg by mouth daily.   HYDROcodone-acetaminophen 5-325 MG tablet Commonly known as:  NORCO/VICODIN Take 1 tablet by mouth every 6 (six) hours as needed for severe pain.   hydrOXYzine 25 MG capsule Commonly known as:  VISTARIL Take 25 mg by mouth 3 (three) times daily as needed.   hydrOXYzine 25 MG tablet Commonly known as:   ATARAX/VISTARIL Take 25 mg by mouth every 6 (six) hours as needed.   ibuprofen 400 MG tablet Commonly known as:  ADVIL,MOTRIN Take 400 mg by mouth every 6 (six) hours as needed for moderate pain.   INVEGA SUSTENNA 234 MG/1.5ML Susy injection Generic drug:  paliperidone Inject 234 mg into the muscle every 30 (thirty) days.   lisinopril 40 MG tablet Commonly known as:  PRINIVIL,ZESTRIL Take 40 mg by mouth daily.   Melatonin 3 MG Tabs Take 6 mg by mouth at bedtime.   metFORMIN 1000 MG tablet Commonly known as:  GLUCOPHAGE Take 1,000 mg by mouth 2 (two) times daily with a meal.   omega-3 acid ethyl esters 1 g capsule Commonly known as:  LOVAZA Take 1 g by mouth 3 (three) times daily.   polyethylene glycol packet Commonly known as:  MIRALAX / GLYCOLAX Take 17 g by mouth daily as needed for mild constipation.   prazosin 2 MG capsule Commonly known as:  MINIPRESS Take 4 mg by mouth at bedtime.   QUEtiapine 200 MG tablet Commonly known as:  SEROQUEL Take 200 mg by mouth at bedtime.   ranitidine 150 MG tablet Commonly known as:  ZANTAC Take 150 mg by mouth daily.       Vitals:   03/22/18 0559 03/22/18 0823  BP: 127/61 130/89  Pulse: 81   Resp: 18   Temp: 98.6 F (37 C)   SpO2: 96%     Tracie ButteJuan G Hanson  Tracie Hanson

## 2018-03-22 NOTE — Clinical Social Work Note (Signed)
Patient discharged back to group home this morning. York SpanielMonica Troyce Gieske MSW,LcSW [phone]
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
## 2018-03-22 NOTE — Progress Notes (Signed)
Per MD okay for RN to place discharge order. Pt's ride is here,

## 2018-04-02 ENCOUNTER — Emergency Department
Admission: EM | Admit: 2018-04-02 | Discharge: 2018-04-03 | Disposition: A | Discharge status: Home / Self Care | Payer: Medicare Other | Attending: Emergency Medicine | Admitting: Emergency Medicine

## 2018-04-02 ENCOUNTER — Emergency Department: Payer: Medicare Other

## 2018-04-02 DIAGNOSIS — Z79899 Other long term (current) drug therapy: Secondary | ICD-10-CM | POA: Diagnosis not present

## 2018-04-02 DIAGNOSIS — K8020 Calculus of gallbladder without cholecystitis without obstruction: Secondary | ICD-10-CM

## 2018-04-02 DIAGNOSIS — R1011 Right upper quadrant pain: Secondary | ICD-10-CM

## 2018-04-02 DIAGNOSIS — E119 Type 2 diabetes mellitus without complications: Secondary | ICD-10-CM | POA: Insufficient documentation

## 2018-04-02 DIAGNOSIS — F1010 Alcohol abuse, uncomplicated: Secondary | ICD-10-CM

## 2018-04-02 DIAGNOSIS — F1094 Alcohol use, unspecified with alcohol-induced mood disorder: Secondary | ICD-10-CM | POA: Diagnosis not present

## 2018-04-02 DIAGNOSIS — F319 Bipolar disorder, unspecified: Secondary | ICD-10-CM | POA: Diagnosis present

## 2018-04-02 DIAGNOSIS — I10 Essential (primary) hypertension: Secondary | ICD-10-CM | POA: Insufficient documentation

## 2018-04-02 DIAGNOSIS — R45851 Suicidal ideations: Secondary | ICD-10-CM

## 2018-04-02 DIAGNOSIS — F79 Unspecified intellectual disabilities: Secondary | ICD-10-CM

## 2018-04-02 LAB — CBC
HEMATOCRIT: 34 % — AB (ref 36.0–46.0)
Hemoglobin: 10.8 g/dL — ABNORMAL LOW (ref 12.0–15.0)
MCH: 27.8 pg (ref 26.0–34.0)
MCHC: 31.8 g/dL (ref 30.0–36.0)
MCV: 87.6 fL (ref 80.0–100.0)
Platelets: 297 10*3/uL (ref 150–400)
RBC: 3.88 MIL/uL (ref 3.87–5.11)
RDW: 14.5 % (ref 11.5–15.5)
WBC: 6.1 10*3/uL (ref 4.0–10.5)
nRBC: 0 % (ref 0.0–0.2)

## 2018-04-02 LAB — URINE DRUG SCREEN, QUALITATIVE (ARMC ONLY)
Amphetamines, Ur Screen: NOT DETECTED
Barbiturates, Ur Screen: NOT DETECTED
Benzodiazepine, Ur Scrn: NOT DETECTED
Cannabinoid 50 Ng, Ur ~~LOC~~: NOT DETECTED
Cocaine Metabolite,Ur ~~LOC~~: NOT DETECTED
MDMA (Ecstasy)Ur Screen: NOT DETECTED
Methadone Scn, Ur: NOT DETECTED
Opiate, Ur Screen: POSITIVE — AB
Phencyclidine (PCP) Ur S: NOT DETECTED
Tricyclic, Ur Screen: POSITIVE — AB

## 2018-04-02 LAB — COMPREHENSIVE METABOLIC PANEL
ALT: 21 U/L (ref 0–44)
AST: 35 U/L (ref 15–41)
Albumin: 3.2 g/dL — ABNORMAL LOW (ref 3.5–5.0)
Alkaline Phosphatase: 55 U/L (ref 38–126)
Anion gap: 11 (ref 5–15)
BUN: 10 mg/dL (ref 6–20)
CO2: 25 mmol/L (ref 22–32)
Calcium: 9.2 mg/dL (ref 8.9–10.3)
Chloride: 103 mmol/L (ref 98–111)
Creatinine, Ser: 0.92 mg/dL (ref 0.44–1.00)
GFR calc Af Amer: >60 mL/min (ref >60)
GFR calc non Af Amer: >60 mL/min (ref >60)
Glucose, Bld: 140 mg/dL — ABNORMAL HIGH (ref 70–99)
Potassium: 3.4 mmol/L — ABNORMAL LOW (ref 3.5–5.1)
Sodium: 139 mmol/L (ref 135–145)
Total Bilirubin: 0.3 mg/dL (ref 0.3–1.2)
Total Protein: 7.3 g/dL (ref 6.5–8.1)

## 2018-04-02 LAB — ETHANOL: Alcohol, Ethyl (B): <10 mg/dL (ref <10)

## 2018-04-02 LAB — POCT PREGNANCY, URINE: Preg Test, Ur: NEGATIVE

## 2018-04-02 LAB — ACETAMINOPHEN LEVEL: Acetaminophen (Tylenol), Serum: <10 ug/mL — ABNORMAL LOW (ref 10–30)

## 2018-04-02 LAB — SALICYLATE LEVEL: Salicylate Lvl: <7 mg/dL (ref 2.8–30.0)

## 2018-04-02 MED ORDER — ONDANSETRON 4 MG PO TBDP
ORAL_TABLET | ORAL | Status: AC
  Administered 2018-04-02: 8 mg via ORAL
  Filled 2018-04-02: qty 2

## 2018-04-02 MED ORDER — DIVALPROEX SODIUM 125 MG PO CSDR: 2000.0000 mg | DELAYED_RELEASE_CAPSULE | Freq: Every day | ORAL | 0 refills | Status: DC

## 2018-04-02 MED ORDER — ONDANSETRON 4 MG PO TBDP
8.0000 mg | ORAL_TABLET | Freq: Once | ORAL | Status: AC
  Administered 2018-04-02: 8 mg via ORAL

## 2018-04-02 MED ORDER — DIVALPROEX SODIUM 500 MG PO DR TAB
2000.0000 mg | DELAYED_RELEASE_TABLET | Freq: Every day | ORAL | Status: DC
  Administered 2018-04-02 – 2018-04-03 (×2): 2000 mg via ORAL
  Filled 2018-04-02 (×2): qty 4

## 2018-04-02 MED ORDER — OXYCODONE-ACETAMINOPHEN 5-325 MG PO TABS
1.0000 | ORAL_TABLET | Freq: Once | ORAL | Status: AC
  Administered 2018-04-02: 1 via ORAL
  Filled 2018-04-02: qty 1

## 2018-04-02 MED ORDER — ONDANSETRON 4 MG PO TBDP
4.0000 mg | ORAL_TABLET | Freq: Once | ORAL | Status: AC
  Administered 2018-04-02: 4 mg via ORAL
  Filled 2018-04-02: qty 1

## 2018-04-02 MED ORDER — MELATONIN 5 MG PO TABS
5.0000 mg | ORAL_TABLET | Freq: Every day | ORAL | Status: DC
  Administered 2018-04-02 – 2018-04-03 (×2): 5 mg via ORAL
  Filled 2018-04-02 (×2): qty 1

## 2018-04-02 MED ORDER — MELATONIN 3 MG PO TABS: 6.0000 mg | ORAL_TABLET | Freq: Every day | ORAL | Status: DC

## 2018-04-02 NOTE — ED Notes (Signed)
Hourly rounding reveals patient in room. No complaints, stable, in no acute distress. Q15 minute rounds and monitoring via Rover and Officer to continue.   

## 2018-04-02 NOTE — ED Notes (Signed)
Hourly rounding reveals patient in [address]. No complaints, stable, in no acute distress. Q15 minute rounds and monitoring via Psychologist, counsellingover and Officer to continue.

## 2018-04-02 NOTE — ED Notes (Signed)
Pt. Moved from quad to BHU #4.  Pt. Calm and cooperative.  Pt. Introduced to unit.  Pt. States "I have been over here before".  Pt. States feeling sad about how she acted at her group home before coming to ED.  Explained to patient that it is ok to feel that way, but have a plan on how to resolve this issue when you get back to group home.  Patient happy and encouraged about this and looking forward to going back to group home.

## 2018-04-02 NOTE — ED Notes (Signed)
Pt. Transferred to BHU from ED to room after screening for contraband. . Pt. Oriented to unit including Q15 minute rounds as well as the security cameras for their protection. Patient is alert and oriented, warm and dry in no acute distress. Patient denies SI, HI, and AVH. Pt. Encouraged to let me know if needs arise.

## 2018-04-02 NOTE — ED Notes (Signed)
Pt observed with no unusual behavior  Sitting up in recliner in the hallway   Appropriate to stimulation  No verbalized needs or concerns at this time  NAD assessed  Continue to monitor
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
## 2018-04-02 NOTE — ED Notes (Signed)
Patient's belonging's placed in labeled belonging's bag. Belonging's include:  1 shirt, 1 jacket, 1 hair tie, 1 bra, 1 pair shoes, 1 pair sandals, 1 pair pants, 1 pair panties.
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
## 2018-04-02 NOTE — ED Notes (Signed)

## 2018-04-02 NOTE — ED Provider Notes (Signed)
-----------------------------------------   4:55 PM on 04/02/2018 -----------------------------------------   Blood pressure 133/76, pulse 86, temperature 98.6 F (37 C), temperature source Oral, resp. rate 18, height 5\' 2"  (1.575 m), weight 122.5 kg, SpO2 97 %.  The patient had no acute events since last update.  Calm and cooperative at this time.  Patient clinically sober at this time.  No suicidal or homicidal ideation.  Taken off commitment by Dr. Toni Amendlapacs.  Plan per psychiatry is for the patient to be treated as an outpatient.  Patient to be discharged back to her group home.   Myrna BlazerSchaevitz, David Matthew, MD 04/02/18 [phone]

## 2018-04-02 NOTE — ED Notes (Signed)
Pt c/o stomach pain after she had a snack. EDP notified.

## 2018-04-02 NOTE — BH Assessment (Signed)
Writer received phone call from Group Home Owner ([phone]) requesting patient Haldol be changed to "sprinkle or liquid." Writer informed her, he was unsure if that would happened and it's best to asked her current prescribe. However, Clinical research associatewriter asked Psych MD (Dr. Toni Amendlapacs) and he agreed to writing a prescription for the change.  Writer called Group Home back and let her know the Psych MD will write a prescription for it. Group Home also shared they were unable to get the patient tonight but will be able to pick her up in the morning. Writer updated ER MD (Dr. Pershing ProudSchaevitz) and patient's RN (Amy T.).

## 2018-04-02 NOTE — ED Notes (Signed)
BEHAVIORAL HEALTH ROUNDING Patient sleeping: No. Patient alert and oriented: yes Behavior appropriate: Yes.  ; If no, describe:  Nutrition and fluids offered: yes Toileting and hygiene offered: Yes  Sitter present: q15 minute observations and security  monitoring Law enforcement present: Yes  ODS  

## 2018-04-02 NOTE — ED Notes (Signed)
Pt was given a snack tray and a ginger ale.  

## 2018-04-02 NOTE — ED Notes (Signed)
ED  Is the patient under IVC or is there intent for IVC: Yes.   Is the patient medically cleared: Yes.   Is there vacancy in the ED BHU: Yes.   Is the population mix appropriate for patient: Yes.   Is the patient awaiting placement in inpatient or outpatient setting:  Has the patient had a psychiatric consult:   Consult pending  Survey of unit performed for contraband, proper placement and condition of furniture, tampering with fixtures in bathroom, shower, and each patient room: Yes.  ; Findings:  APPEARANCE/BEHAVIOR Calm and cooperative NEURO ASSESSMENT Orientation: oriented x3   Hallucinations: No.None noted (Hallucinations)  denies Speech: Normal Gait: normal RESPIRATORY ASSESSMENT Even  Unlabored respirations  CARDIOVASCULAR ASSESSMENT Pulses equal   regular rate  Skin warm and dry   GASTROINTESTINAL ASSESSMENT no GI complaint EXTREMITIES Full ROM  PLAN OF CARE Provide calm/safe environment. Vital signs assessed twice daily. ED BHU Assessment once each 12-hour shift. Collaborate with TTS daily or as condition indicates. Assure the ED provider has rounded once each shift. Provide and encourage hygiene. Provide redirection as needed. Assess for escalating behavior; address immediately and inform ED provider.  Assess family dynamic and appropriateness for visitation as needed: Yes.  ; If necessary, describe findings:  Educate the patient/family about BHU procedures/visitation: Yes.  ; If necessary, describe findings:

## 2018-04-02 NOTE — BH Assessment (Signed)
Assessment Note  Tracie Hanson is an 27 y.o. female who presents to the ED with c/o SI/HI. Pt reports that she has no intentions of self harm or harming any one else. She reports that she has an ink pen and her intentions were to "write on" her boyfriend [place]. She reports that he brought a bottle of Brandy to the group home and she took the bottle to the park and drank half of it. She reports that after she became intoxicated and "feeling good" that she became angry and agressive towards the people in the home.   Pt's BOL scored at a .10  Pt denies SI/HI A/V H/D Pt reports pending court charge for "attempted murder and assault"   Diagnosis: Bipolar 1  Past Medical History:  Past Medical History:  Diagnosis Date  . Bipolar 1 disorder (HCC)   . Diabetes mellitus without complication (HCC)   . Hypertension   . Intellectual disability   . Personality disorder (HCC)   . Schizoaffective disorder (HCC)     History reviewed. No pertinent surgical history.  Family History: No family history on file.  Social History:  reports that she has never smoked. She has never used smokeless tobacco. She reports that she does not drink alcohol or use drugs.  Additional Social History:  Alcohol / Drug Use Pain Medications: SEE MAR Prescriptions: SEE MAR Over the Counter: SEE MAR History of alcohol / drug use?: No history of alcohol / drug abuse  CIWA: CIWA-Ar BP: 127/78 Pulse Rate: (!) 103 COWS:    Allergies:  Allergies  Allergen Reactions  . Klonopin [Clonazepam] Other (See Comments)    Seizures  . Other Hives    Celery    Home Medications: (Not in a hospital admission)   OB/GYN Status:  No LMP recorded.  General Assessment Data Location of Assessment: [facility] ED TTS Assessment: In system Is this a Tele or Face-to-Face Assessment?: Face-to-Face Is this an Initial Assessment or a Re-assessment for this encounter?: Initial Assessment Patient Accompanied by:: N/A Language Other than  English: No Living Arrangements: In Group Home: (Comment: Name of Group Home) What gender do you identify as?: Female Marital status: Single Maiden name: Castagna Pregnancy Status: No Living Arrangements: Group Home Can pt return to current living arrangement?: Yes Admission Status: Involuntary Petitioner: Other Is patient capable of signing voluntary admission?: No Referral Source: Self/Family/Friend Insurance type: Medicare  Medical Screening Exam [facility] Walk-in ONLY) Medical Exam completed: Yes  Crisis Care Plan Living Arrangements: Group Home Legal Guardian: Other: Name of Psychiatrist: n/a Name of Therapist: n/a  Education Status Is patient currently in school?: No Is the patient employed, unemployed or receiving disability?: Receiving disability income  Risk to self with the past 6 months Suicidal Ideation: No Has patient been a risk to self within the past 6 months prior to admission? : No Suicidal Intent: No Has patient had any suicidal intent within the past 6 months prior to admission? : No Is patient at risk for suicide?: No Suicidal Plan?: No Has patient had any suicidal plan within the past 6 months prior to admission? : No Access to Means: No What has been your use of drugs/alcohol within the last 12 months?: pt reports drinking a half bottle of Brandy Previous Attempts/Gestures: Yes How many times?: 4 Other Self Harm Risks: n/a Triggers for Past Attempts: Unpredictable Intentional Self Injurious Behavior: None Family Suicide History: No Recent stressful life event(s): Conflict (Comment) Persecutory voices/beliefs?: No Depression: No Depression Symptoms: (Denies depression) Substance  abuse history and/or treatment for substance abuse?: No Suicide prevention information given to non-admitted patients: Not applicable  Risk to Others within the past 6 months Homicidal Ideation: No(Pt denies) Does patient have any lifetime risk of violence toward others  beyond the six months prior to admission? : No Thoughts of Harm to Others: No Current Homicidal Intent: No Current Homicidal Plan: No Access to Homicidal Means: No Identified Victim: Pt denies having a victim History of harm to others?: Yes Assessment of Violence: None Noted Does patient have access to weapons?: No Criminal Charges Pending?: Yes Describe Pending Criminal Charges: Pt reports pending charges for attempted murder and assualt.  Does patient have a court date: Yes Court Date: 06/10/17 Is patient on probation?: No  Psychosis Hallucinations: None noted Delusions: None noted  Mental Status Report Appearance/Hygiene: Unremarkable Eye Contact: Fair Motor Activity: Freedom of movement Speech: Logical/coherent, Soft, Slow Level of Consciousness: Quiet/awake Mood: Sad Affect: Appropriate to circumstance Anxiety Level: Minimal Thought Processes: Coherent, Relevant Judgement: Partial Orientation: Person, Time, Place, Situation, Appropriate for developmental age     ADLScreening [facility] Assessment Services) Patient's cognitive ability adequate to safely complete daily activities?: Yes Patient able to express need for assistance with ADLs?: Yes Independently performs ADLs?: Yes (appropriate for developmental age)  Prior Inpatient Therapy Prior Inpatient Therapy: No  Prior Outpatient Therapy Prior Outpatient Therapy: Yes Prior Therapy Dates: 2019, 2018,2017 Prior Therapy Facilty/Provider(s): Several Reason for Treatment: Bipolar Does patient have an ACCT team?: No Does patient have Intensive In-House Services?  : No Does patient have [facility] services? : No Does patient have P4CC services?: No  ADL Screening (condition at time of admission) Patient's cognitive ability adequate to safely complete daily activities?: Yes Is the patient deaf or have difficulty hearing?: No Does the patient have difficulty seeing, even when wearing glasses/contacts?: No Does the patient  have difficulty concentrating, remembering, or making decisions?: No Patient able to express need for assistance with ADLs?: Yes Does the patient have difficulty dressing or bathing?: No Independently performs ADLs?: Yes (appropriate for developmental age) Does the patient have difficulty walking or climbing stairs?: No Weakness of Legs: None Weakness of Arms/Hands: None  Home Assistive Devices/Equipment Home Assistive Devices/Equipment: None  Therapy Consults (therapy consults require a physician order) PT Evaluation Needed: No OT Evalulation Needed: No SLP Evaluation Needed: No       Advance Directives (For Healthcare) Does Patient Have a Medical Advance Directive?: No Would patient like information on creating a medical advance directive?: No - Patient declined          Disposition:  Disposition Initial Assessment Completed for this Encounter: Yes Disposition of Patient: (Pending disposition) Patient refused recommended treatment: No Mode of transportation if patient is discharged/movement?: Car Patient referred to: (Pending)  On Site Evaluation by:   Reviewed with Physician:    Kemia Wendel D Cally Nygard 04/02/2018 7:40 AM

## 2018-04-02 NOTE — ED Notes (Signed)
BEHAVIORAL HEALTH ROUNDING Patient sleeping: Yes.   Patient alert and oriented: eyes closed  Appears to be asleep Behavior appropriate: Yes.  ; If no, describe:  Nutrition and fluids offered: Yes  Toileting and hygiene offered: sleeping Sitter present: q 15 minute observations and security monitoring Law enforcement present: yes  ODS 

## 2018-04-03 DIAGNOSIS — F319 Bipolar disorder, unspecified: Secondary | ICD-10-CM | POA: Diagnosis not present

## 2018-04-03 MED ORDER — HYDROXYZINE HCL 25 MG PO TABS
50.0000 mg | ORAL_TABLET | Freq: Once | ORAL | Status: AC | PRN
  Administered 2018-04-03: 50 mg via ORAL
  Filled 2018-04-03: qty 2

## 2018-04-03 NOTE — ED Notes (Signed)
BEHAVIORAL HEALTH ROUNDING  Patient sleeping: No.  Patient alert and oriented: yes  Behavior appropriate: Yes. ; If no, describe:  Nutrition and fluids offered: Yes  Toileting and hygiene offered: Yes  Sitter present: not applicable, Q 15 min safety rounds and observation via security camera. Law enforcement present: Yes ODS  

## 2018-04-03 NOTE — ED Notes (Signed)
Latoya called and said the group home could not pick up the pt until 2100, whereupon Gloris ManchesterLucinda Davis [phone] would pick her up.

## 2018-04-03 NOTE — ED Notes (Signed)
Spoke again with Tracie Hanson, who said she thought she had texted her daughter and said she would follow up. Ms. Tracie Hanson was again given this unit's phone number.

## 2018-04-03 NOTE — ED Notes (Signed)
Called Group Home Owner [phone] and reached voice mail, which gave instructions to call Garnetta at [phone]. Left HIPPA-compliant voicemail and called [phone]. Spoke with Quillian QuinceGarnetta, who said she would speak with Latoya and callback at [phone] number.

## 2018-04-03 NOTE — ED Notes (Signed)
Per Dr. Pershing ProudSchaevitz, pt's hs meds may be given at approx 1850 to prepare pt for discharge.

## 2018-04-03 NOTE — ED Notes (Signed)
ENVIRONMENTAL ASSESSMENT  Potentially harmful objects out of patient reach: Yes.  Personal belongings secured: Yes.  Patient dressed in hospital provided attire only: Yes.  Plastic bags out of patient reach: Yes.  Patient care equipment (cords, cables, call bells, lines, and drains) shortened, removed, or accounted for: Yes.  Equipment and supplies removed from bottom of stretcher: Yes.  Potentially toxic materials out of patient reach: Yes.  Sharps container removed or out of patient reach: Yes.   BEHAVIORAL HEALTH ROUNDING  Patient sleeping: No.  Patient alert and oriented: yes  Behavior appropriate: Yes. ; If no, describe:  Nutrition and fluids offered: Yes  Toileting and hygiene offered: Yes  Sitter present: not applicable, Q 15 min safety rounds and observation via security camera. Law enforcement present: Yes ODS  ED BHU PLACEMENT JUSTIFICATION  Is the patient under IVC or is there intent for IVC:   Is the patient medically cleared: Yes.  Is there vacancy in the ED BHU: Yes.  Is the population mix appropriate for patient: Yes.  Is the patient awaiting placement in inpatient or outpatient setting: pending discharge awaiting group home to pick up pt.  Has the patient had a psychiatric consult: Yes.  Survey of unit performed for contraband, proper placement and condition of furniture, tampering with fixtures in bathroom, shower, and each patient room: Yes. ; Findings: All clear  APPEARANCE/BEHAVIOR  calm, cooperative and adequate rapport can be established  NEURO ASSESSMENT  Orientation: time, place and person  Hallucinations: No.None noted (Hallucinations)  Speech: Normal  Gait: normal  RESPIRATORY ASSESSMENT  WNL  CARDIOVASCULAR ASSESSMENT  WNL  GASTROINTESTINAL ASSESSMENT  WNL  EXTREMITIES  WNL  PLAN OF CARE  Provide calm/safe environment. Vital signs assessed TID. ED BHU Assessment once each 12-hour shift. Collaborate with TTS daily or as condition indicates. Assure  the ED provider has rounded once each shift. Provide and encourage hygiene. Provide redirection as needed. Assess for escalating behavior; address immediately and inform ED provider.  Assess family dynamic and appropriateness for visitation as needed: Yes. ; If necessary, describe findings:  Educate the patient/family about BHU procedures/visitation: Yes. ; If necessary, describe findings: Pt is calm and cooperative at this time. Pt understanding and accepting of unit procedures/rules. Will continue to monitor with Q 15 min safety rounds and observation via security camera.

## 2018-04-03 NOTE — ED Notes (Signed)
Earlier, group home representative Latoya called this writer back and said she would not pick up Tracie Hanson until after she had been given her night medications, which are scheduled at 2200. This Clinical research associatewriter reminded her that the pt was cleared for discharge last night and it had been understood that pt would be discharged this a.m. She worries about pt's aggression and does not feel comfortable taking her back without night medications on board. Notified Calvin, LCSW, and Claudine, LCSW.

## 2018-04-19 ENCOUNTER — Other Ambulatory Visit: Payer: Self-pay | Admitting: Surgery

## 2018-04-23 ENCOUNTER — Ambulatory Visit: Payer: Self-pay | Admitting: Surgery

## 2018-04-23 NOTE — H&P (Signed)
Subjective:   CC: Chronic cholecystitis [K81.1] POSTOP  HPI:  Tracie Hanson is a 28 y.o. female who is here for followup from above.  Had another pain exacerbation but no signs of acute cholecystitis so sent home again. She still complains of RUQ pain associated with nausea, but now has some LLQ pain and decreased BM.    Current Medications: has a current medication list which includes the following prescription(s): balcoltra, divalproex, divalproex, duloxetine, hydrochlorothiazide, hydroxyzine pamoate, ibuprofen, ibuprofen, invega sustenna, lamotrigine, lisinopril, melatonin, melatonin-pyridoxine (vit b6), metformin, omega-3 acid ethyl esters, paliperidone palmitate, polyethylene glycol, prazosin, quetiapine, ranitidine, symbicort, trazodone, and ventolin hfa.  Allergies:       Allergies  Allergen Reactions  . Clonazepam Other (See Comments)    Seizures  . Celery (Apium Graveolens) (Umbelliferae) Unknown    ROS: A 15 point review of systems was performed and pertinent positives and negatives noted in HPI   Objective:   BP (!) 167/105   Pulse 92   Temp 37 C (98.6 F) (Oral)   Ht 157.5 cm (5\' 2" )   Wt (!) 126.3 kg (278 lb 6.4 oz)   BMI 50.92 kg/m   Constitutional :  alert, appears stated age, cooperative and no distress  Gastrointestinal: soft, no guarding, but noted to have focal tenderness in RUQ and LLQ, equal in intensity today.    Musculoskeletal: Steady gait and movement  Skin: Cool and moist, incision clean, dry, intact.  No erythema, induration or drainage to indicate infection.    Psychiatric: Normal affect, non-agitated, not confused       LABS:  N/A   RADS: N/A  Assessment:      Chronic cholecystitis [K81.1]  Morbid obesity  Plan:   1. Still likely having episodic flares.  LLQ likely from constipation resulting from narcotic use.  Discussed the risk of surgery including post-op infxn, seroma, biloma, chronic pain, poor-delayed wound  healing, retained gallstone, conversion to open procedure, post-op SBO or ileus, and need for additional procedures to address said risks.  The risks of general anesthetic including MI, CVA, sudden death or even reaction to anesthetic medications also discussed. Alternatives include continued observation.  Benefits include possible symptom relief, prevention of complications including acute cholecystitis, pancreatitis.  Typical post operative recovery of 3-5 days rest, continued pain in area and incision sites, possible loose stools up to 4-6 weeks, also discussed.  ED return precautions given for sudden increase in RUQ pain, with possible accompanying fever, nausea, and/or vomiting. Will proceed with lap chole, since patient still actively having discomfort and also having frequent nausea with food.  Advised patient to stay away from fatty foods until surgery.  Will ask for assistance from bariatric service at time of surgery as well secondary to her obesity causing her to be at high risk for complications.  This was also discussed with patient and she is still agreeable to surgery.  Guardian also acknowledges and requested to proceed with surgery as well.    Electronically signed by Sung Amabile, DO on 04/09/2018 2:30 PM

## 2018-04-30 ENCOUNTER — Other Ambulatory Visit: Payer: Self-pay

## 2018-04-30 ENCOUNTER — Encounter
Admission: RE | Admit: 2018-04-30 | Discharge: 2018-04-30 | Disposition: A | Discharge status: Home / Self Care | Payer: Medicare Other | Source: Ambulatory Visit | Attending: Surgery | Admitting: Surgery

## 2018-04-30 DIAGNOSIS — Z01812 Encounter for preprocedural laboratory examination: Secondary | ICD-10-CM | POA: Insufficient documentation

## 2018-04-30 HISTORY — DX: Hyperprolactinemia: E22.1

## 2018-04-30 HISTORY — DX: Narcissistic personality disorder: F60.81

## 2018-04-30 HISTORY — DX: Antisocial personality disorder: F60.2

## 2018-04-30 HISTORY — DX: Gastro-esophageal reflux disease without esophagitis: K21.9

## 2018-04-30 HISTORY — DX: Anemia, unspecified: D64.9

## 2018-04-30 NOTE — Patient Instructions (Signed)
Your procedure is scheduled on: Thursday 05/06/2018 Report to [place]. To find out your arrival time please call [phone] between 1PM - 3PM on Wednesday 05/05/2018.  Remember: Instructions that are not followed completely may result in serious medical risk, up to and including death, or upon the discretion of your surgeon and anesthesiologist your surgery may need to be rescheduled.     _X__ 1. Do not eat food after midnight the night before your procedure.                 No gum chewing or hard candies. You may drink clear liquids up to 2 hours                 before you are scheduled to arrive for your surgery- DO not drink clear                 liquids within 2 hours of the start of your surgery.                 Clear Liquids include:  water, apple juice without pulp, clear carbohydrate                 drink such as Clearfast or Gatorade, Black Coffee or Tea (Do not add                 anything to coffee or tea).  __X__2.  On the morning of surgery brush your teeth with toothpaste and water, you                 may rinse your mouth with mouthwash if you wish.  Do not swallow any              toothpaste of mouthwash.     _X__ 3.  No Alcohol for 24 hours before or after surgery.   _X__ 4.  Do Not Smoke or use e-cigarettes For 24 Hours Prior to Your Surgery.                 Do not use any chewable tobacco products for at least 6 hours prior to                 surgery.  ____  5.  Bring all medications with you on the day of surgery if instructed.   __X__  6.  Notify your doctor if there is any change in your medical condition      (cold, fever, infections).     Do not wear jewelry, make-up, hairpins, clips or nail polish. Do not wear lotions, powders, or perfumes.  Do not shave 48 hours prior to surgery. Men may shave face and neck. Do not bring valuables to the hospital.    [facility] is not responsible for any belongings  or valuables.  Contacts, dentures/partials or body piercings may not be worn into surgery. Bring a case for your contacts, glasses or hearing aids, a denture cup will be supplied. Leave your suitcase in the car. After surgery it may be brought to your room. For patients admitted to the hospital, discharge time is determined by your treatment team.   Patients discharged the day of surgery will not be allowed to drive home.   Please read over the following fact sheets that you were given:   MRSA Information  __X__ Take these medicines the morning of surgery with A SIP OF WATER:  1. divalproex (DEPAKOTE)  2. ranitidine (ZANTAC)  3. albuterol (PROVENTIL HFA;VENTOLIN HFA)  4. budesonide-formoterol (SYMBICORT)  5.  6.  ____ Fleet Enema (as directed)   __X__ Use CHG Soap/SAGE wipes as directed  __X__ Use inhalers on the day of surgery  __X__ Stop metformin/Janumet/Farxiga 2 days prior to surgery    ____ Take 1/2 of usual insulin dose the night before surgery. No insulin the morning          of surgery.   ____ Stop Blood Thinners Coumadin/Plavix/Xarelto/Pleta/Pradaxa/Eliquis/Effient/Aspirin  on   Or contact your Surgeon, Cardiologist or Medical Doctor regarding  ability to stop your blood thinners  __X__ Stop Anti-inflammatories 7 days before surgery such as Advil, Ibuprofen, Motrin,  BC or Goodies Powder, Naprosyn, Naproxen, Aleve, Aspirin    __X__ Stop all herbal supplements, fish oil or vitamin E until after surgery.    ____ Bring C-Pap to the hospital.

## 2018-04-30 NOTE — Pre-Procedure Instructions (Signed)
Patients guardian arrives states patient refused to get out of bed and come to this appt. Pre-op interview with guardian completed as much as possible and instructions provided. Guardian will not be with patient DOS. She will arrive via group home staff and discharged back to their care. Contact person  Wynelle Bourgeois [phone]

## 2018-05-06 ENCOUNTER — Encounter: Admission: RE | Payer: Self-pay | Source: Ambulatory Visit

## 2018-05-06 ENCOUNTER — Ambulatory Visit: Admission: RE | Admit: 2018-05-06 | Payer: Medicare Other | Source: Ambulatory Visit | Admitting: Surgery

## 2018-05-06 SURGERY — LAPAROSCOPIC CHOLECYSTECTOMY
Anesthesia: General

## 2020-01-10 IMAGING — CT CT ABD-PELV W/ CM
2 of 4 series · 16 of 46 positions shown, 18 images · IV contrast (APPLIED)
Comparison: None.

CLINICAL DATA: Acute abdominal pain.

EXAM:
CT ABDOMEN AND PELVIS WITH CONTRAST
TECHNIQUE: Multidetector CT imaging of the abdomen and pelvis was performed
using the standard protocol following bolus administration of
intravenous contrast.
CONTRAST:  125mL OMNIPAQUE IOHEXOL 300 MG/ML  SOLN

[Series 2: routine abd/pel with · axial · 0.88mm/px · z∈[-864,-384]mm · 13 of 106 slices shown, 15 images]
[im 5/106  soft-tissue]
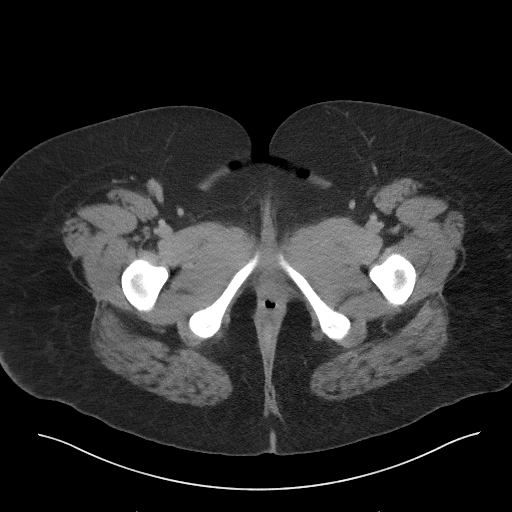
[im 5/106  bone]
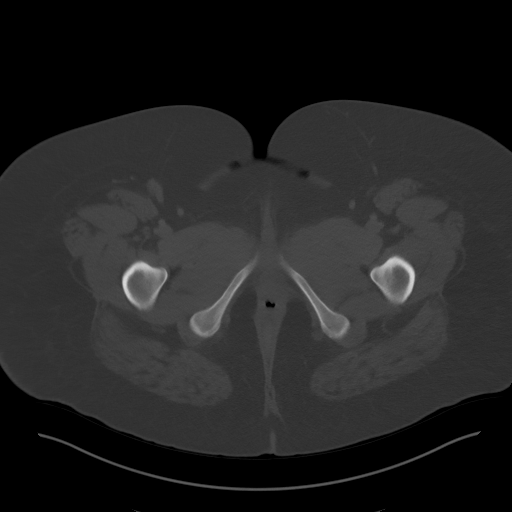
[im 13/106  soft-tissue]
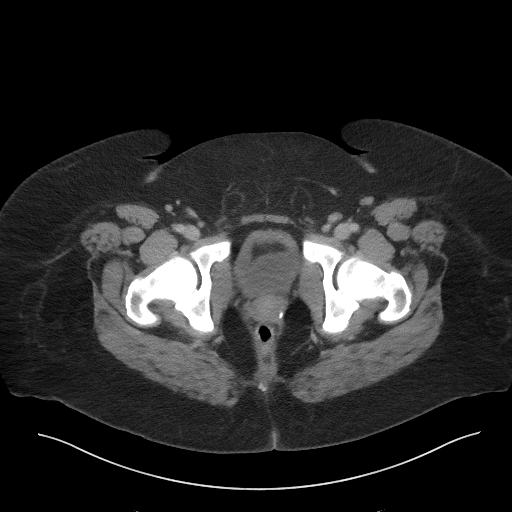
[im 22/106  soft-tissue]
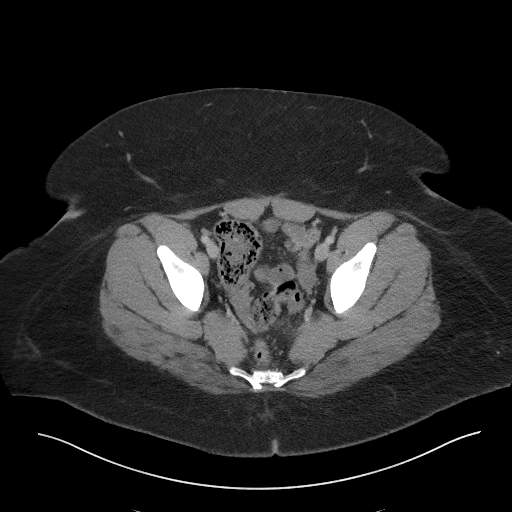
[im 30/106  soft-tissue]
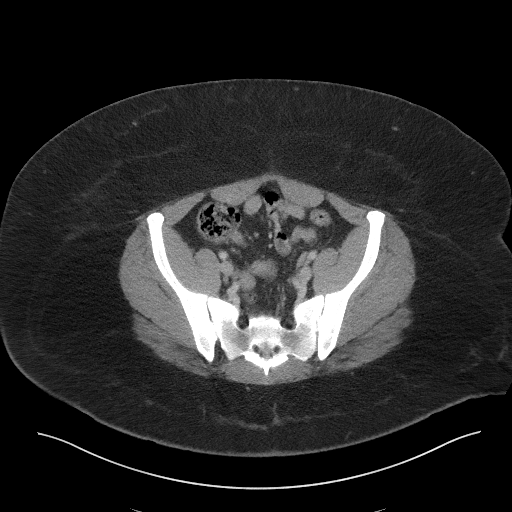
[im 38/106  soft-tissue]
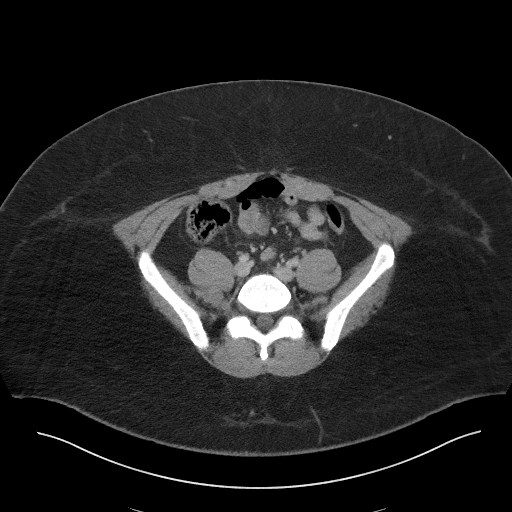
[im 47/106  soft-tissue]
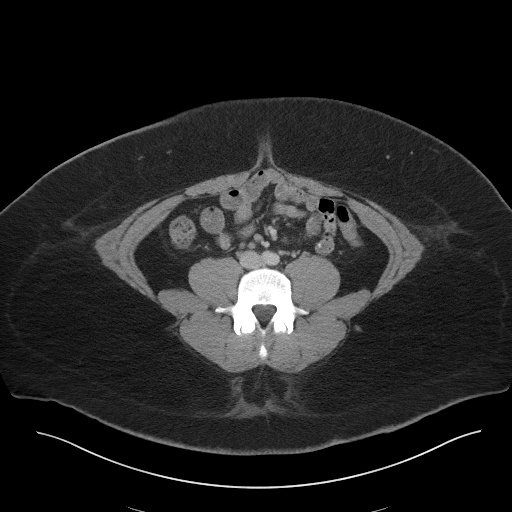
[im 55/106  soft-tissue]
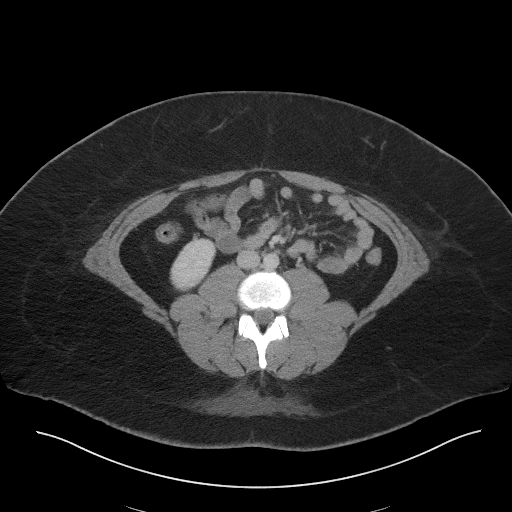
[im 59/106  soft-tissue]
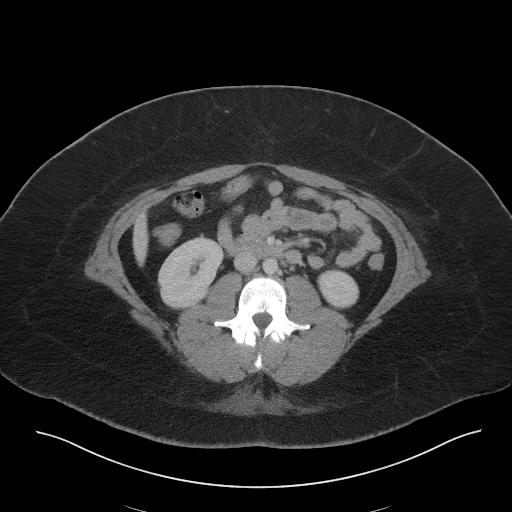
[im 68/106  soft-tissue]
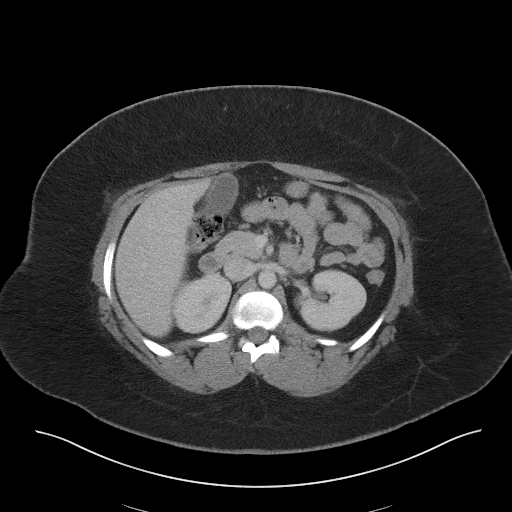
[im 68/106  bone]
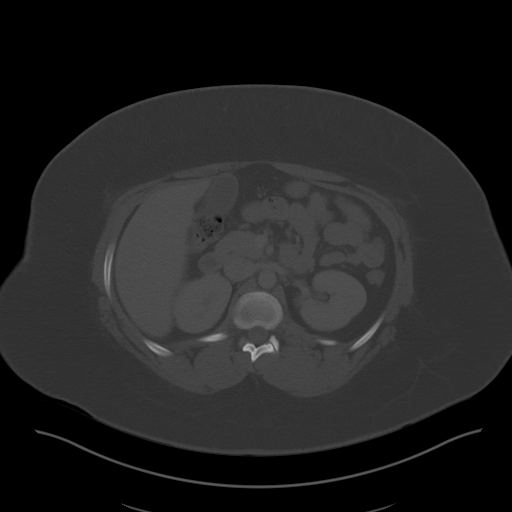
[im 76/106  soft-tissue]
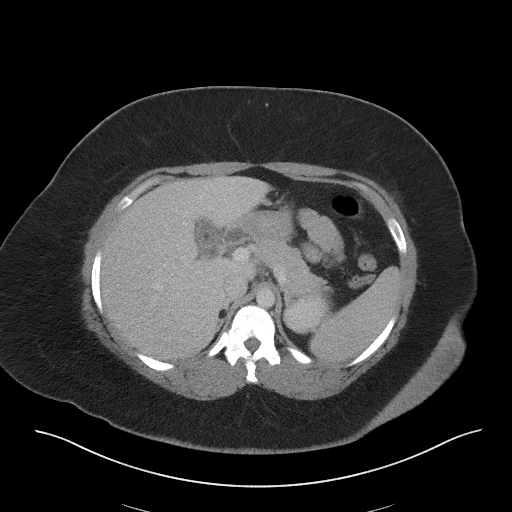
[im 85/106  soft-tissue]
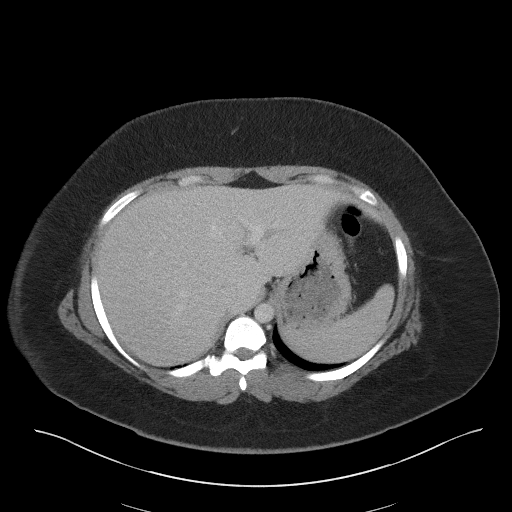
[im 93/106  soft-tissue]
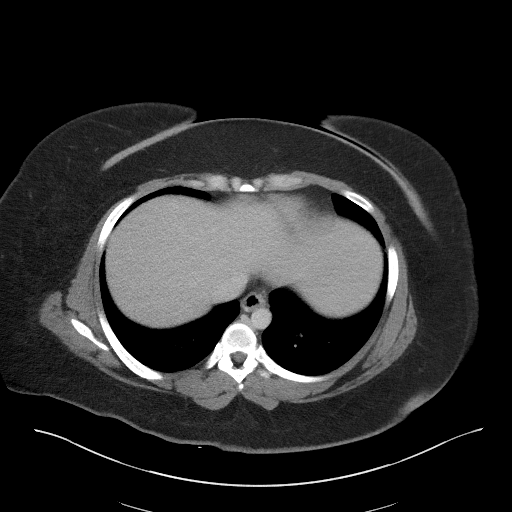
[im 101/106  soft-tissue]
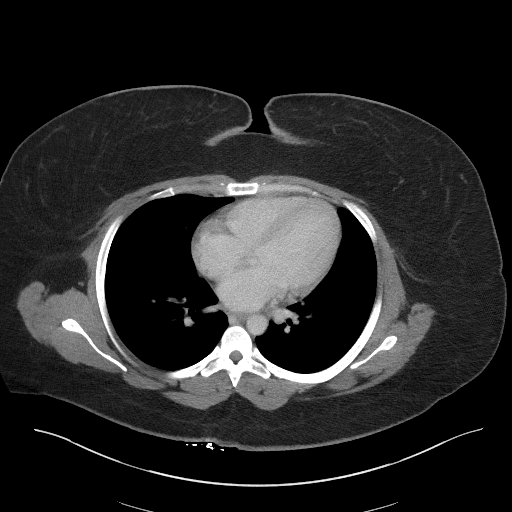

[Series 5: coronal st · coronal · 0.83mm/px · 3 of 82 slices shown]
[im 28/82  soft-tissue]
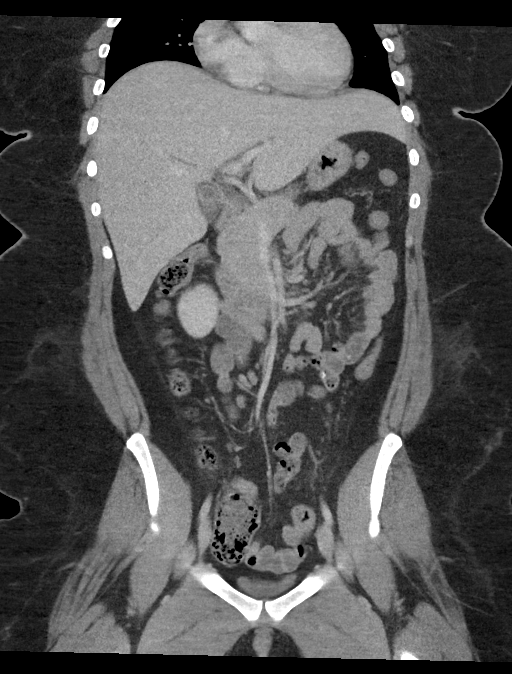
[im 37/82  soft-tissue]
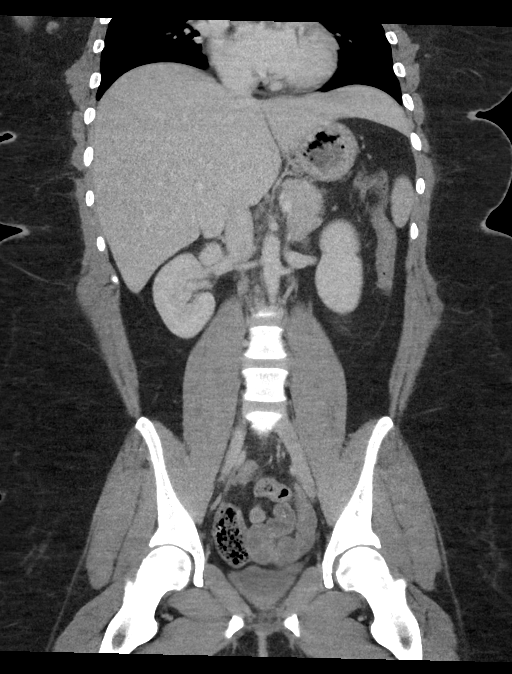
[im 46/82  soft-tissue]
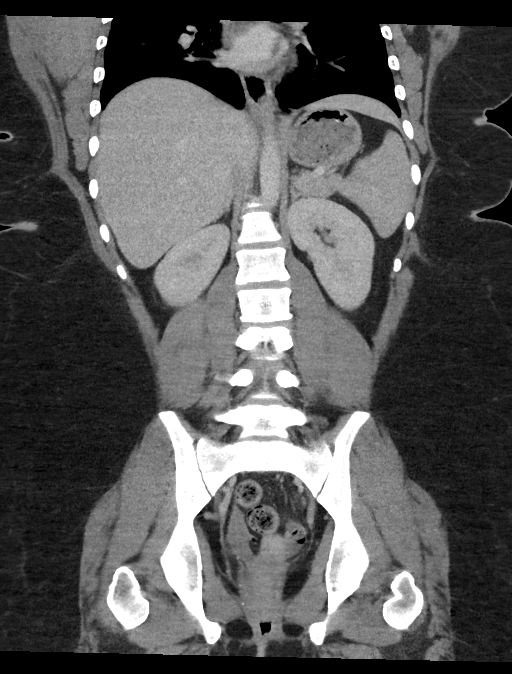

[16 of 46 positions shown; findings below may reference images not displayed]

FINDINGS: Lower chest: The lung bases are clear.

Hepatobiliary: Prominent size liver with elongated left lobe.
Gallstones including stone in the gallbladder neck. Mild gallbladder
wall thickening and pericholecystic stranding. Upper normal common
bile duct measuring 6 mm. No visualized choledocholithiasis.

Pancreas: No ductal dilatation or inflammation.

Spleen: Normal in size without focal abnormality.

Adrenals/Urinary Tract: 8 mm myelolipoma in the right adrenal gland.
Normal left adrenal gland. No hydronephrosis or perinephric edema.
Homogeneous renal enhancement. Subcentimeter low-density in the left
mid kidney is too small to characterize but likely cyst. Urinary
bladder is nondistended.

Stomach/Bowel: Stomach is within normal limits. No evidence of bowel
wall thickening, distention, or inflammatory changes. Cecum is
low-lying in the central pelvis. Normal appendix located in the
midline pelvis.

Vascular/Lymphatic: No significant vascular findings are present. No
enlarged abdominal or pelvic lymph nodes.

Reproductive: Uterus and bilateral adnexa are unremarkable.

Other: No free air, free fluid, or intra-abdominal fluid collection.

Musculoskeletal: There are no acute or suspicious osseous
abnormalities.
IMPRESSION: 1. Cholelithiasis including a stone in the gallbladder neck. Mild
gallbladder wall thickening and pericholecystic stranding. Findings
highly suspicious for acute cholecystitis.
2. Upper normal common bile duct measuring 6 mm.
3. Incidental right adrenal myelolipoma.

## 2024-05-01 NOTE — ED Provider Notes (Signed)
 "   [facility] Emergency Department Provider Note Room: [facility]  D36/D36  Medical Decision Making   BP (!) 163/101   Pulse 84   Temp 98.4 F (36.9 C)   Resp (!) 24   Ht 1.549 m (5' 1)   Wt (!) 118.1 kg (260 lb 5.8 oz)   SpO2 100%   BMI 49.20 kg/m   This is a 34 y.o. female with a past medical history of schizoaffective disorder, bipolar disorder, hypertension, T2DM brought in by EMS from [facility] to the emergency department for evaluation of abdominal pain.  Vitals notable for hypertension to 163/101.  On exam patient is lying in bed in no acute distress, normal cardiopulmonary exam, soft nondistended nontender abdomen.  Differential diagnosis includes threatened abortion, spontaneous abortion, PPROM, ectopic pregnancy, acute gastroenteritis, viral syndrome, among many other potential etiologies.  Plan for blood work, urine studies, OB first trimester ultrasound, will treat with Tylenol  and reassess.  Progress Notes   2:14 AM: CBC shows hemoglobin of 10.  CMP unremarkable.  hCG is elevated to 59,350.  Called to patient's bedside as patient has repeatedly refused the ultrasound stating that she would like to be discharged.  She is tearful stating that she just wants to go back to [facility].  I spoke at length with the patient about risks of refusing ultrasound including ectopic pregnancy.  Patient understands but is still declining and would like to be discharged.  Patient has capacity, she does not appear to be actively psychotic, is not responding to internal stimuli.  She is able to repeat back to me that she is at risk for ectopic pregnancy or other complications.  Will have her leave AGAINST MEDICAL ADVICE.  Instructed her that she could return at any time if she would like further evaluation.      MDM Elements       Escalation of Care including OBS/Admission/Transfer was considered: Against medical vice    Diagnostic tests considered but not performed:  OB ultrasound considered-patient declined      Disposition   Clinical Impression:     Diagnosis Comment Added By Time Added   Abdominal pain during pregnancy in first trimester  Lonni Redell Paschal, MD 05/01/2024  2:15 AM      Final Disposition: AMA (SAW PROVIDER)  History   Chief Complaint in Triage  Patient presents with   Abdominal Pain    Pt BIBA from [facility] for abdominal pain. Pt reports back pain and vaginal pain and pressure. Pt reports being pregnant in first trimester. Last LMP Oct 2025, EDD Sept 22. Denies vaginal bleeding. No prenatal care.     HOMER PFEIFER is a 34 y.o. female with a past medical history of schizoaffective disorder, bipolar disorder, hypertension, T2DM brought in by EMS from [facility] to the emergency department for evaluation of abdominal pain.  Patient currently inpatient at [facility]  Tonight she got into an argument with staff and urinated on the floor.  Patient then complained of abdominal pain and back pain.  She states that she is currently pregnant with LMP 01/2024.  Patient unclear of dating and states that she had an ultrasound in the past but cannot remember when.  No vaginal bleeding, fevers, flank pain.  No vomiting or diarrhea.  Endorsing headache, back pain, abdominal cramping, lower back pain.  Staff at [facility] concerned that she had premature rupture of membranes which prompted them to send her to the emergency department for further  evaluation.  Addition Historian(s): EMS - report taken from EMS     MEDICATIONS:  Patient's Medications  Previous Medications   BENZTROPINE (COGENTIN) 1 MG TABLET    Take 1 mg by mouth 2 (two) times a day.   BROMOCRIPTINE (PARLODEL) 2.5 MG TABLET    Take 2.5 mg by mouth daily.   LISINOPRIL  (ZESTRIL ) 10 MG TABLET    Take 10 mg by mouth daily.   METFORMIN (GLUCOPHAGE) 500 MG TABLET    Take 500 mg by mouth 2 (two) times a day with meals.   PANTOPRAZOLE (PROTONIX) 40 MG TABLET    Take 40 mg by mouth  daily.  Modified Medications   No medications on file    ALLERGIES: Allergies[1]  PAST MEDICAL HISTORY:  Past Medical History[2]  PAST SURGICAL HISTORY: Past Surgical History[3]  SOCIAL HISTORY:  Social History   Tobacco Use   Smoking status: Never   Smokeless tobacco: Not on file  Substance Use Topics   Alcohol use: No       FAMILY HISTORY:  Family History[4]    Physical Exam    ED Triage Vitals [05/01/24 0049]  Temp 98.4 F (36.9 C)  Temp src   Heart Rate 84  BP (!) 163/101  Respirations (!) 22  SpO2 100 %   Reviewed vital signs and nursing note as charted by RN.  Adult Medical Physical Exam CONSTITUTIONAL: Well-appearing; well-nourished. In no acute distress. Alert and oriented and responds appropriately to questions.  HEAD: Normocephalic; atraumatic EYES: Conjunctivae clear, sclerae non-icteric ENT: Moist mucous membranes. No congestion.  NECK: Supple without meningismus CARD: Normal rate, regular rhythm. Skin is well perfused, no JVD RESP: Normal respiratory effort. Breath sounds are normal.  ABD/GI:  Soft, nondistended, nontender EXT: No long bone deformities. No lower extremity edema. SKIN: Normal color for age and race; warm; dry; no acute lesions noted NEURO: Alert, oriented. Normal speech and language. Moves all extremities PSYCH: Mood and manner are appropriate. Grooming and personal hygiene are appropriate.    Results    I personally reviewed the results in the chart as listed below  Results for orders placed or performed during the hospital encounter of 05/01/24  CBC with Differential  Result Value Ref Range   Differential Percent Diff %    Differential Absolute Diff Absolute    WBC 7.8 3.6 - 11.2 K/uL   RBC 4.09 3.63 - 4.92 M/uL   Hemoglobin 10.0 (L) 10.9 - 14.3 g/dL   Hematocrit 31 31 - 42 %   Mean Cell Volume 77 74 - 96 fL   Mean Cell Hemoglobin 24 24 - 33 pg   Mean Cell Hemoglobin Concentration 32 (L) 33 - 36 g/dL   RDW 83.0  87.6 - 82.9 %   Platelet Count 465 (H) 150 - 450 K/uL   Mean Platelet Volume 8.4 7.5 - 11.2 fL   Neutrophils 62 43 - 77 %   Lymphocytes 23 16 - 44 %   Monocytes 13 5 - 13 %   Eosinophils 1 1 - 8 %   Basophils 1 0 - 1 %   Neutrophils Absolute 4.9 1.8 - 7.8 K/uL   Lymphocytes Absolute 1.8 1.0 - 3.0 K/uL   Monocytes Absolute 1.0 0.3 - 1.0 K/uL   Eosinophils Absolute 0.1 0.0 - 0.5 K/uL   Basophils Absolute 0.1 0.0 - 0.1 K/uL   Nucleated RBCS 0 /100 WBC  CMP  Result Value Ref Range   Sodium 134 (L) 136 - 145 mmol/L  Potassium 4.4 3.5 - 5.1 mmol/L   Chloride 102 98 - 107 mmol/L   CO2 20 (L) 21 - 31 mmol/L   BUN 10 7 - 25 mg/dL   Creatinine 9.21 9.39 - 1.20 mg/dL   Glucose, Random 85 70 - 199 mg/dL   Calcium, Total 9.3 8.6 - 10.3 mg/dL   Osmolality (calculated) 267 (L) 270 - 295 mOsm/kg   Anion Gap 12 4 - 12   Albumin 3.3 (L) 3.5 - 5.7 g/dL   Bilirubin, Total 0.3 0.3 - 1.0 mg/dL   Alkaline Phosphatase 73 34 - 104 IU/L   ALT 8 7 - 52 IU/L   AST 17 13 - 39 IU/L   Protein, Total 6.4 6.4 - 8.9 g/dL   Albumin/Globulin Ratio 1.1 (L) 1.2 - 2.3  hCG, Quantitative  Result Value Ref Range   HCG Quantitative 59,350 (H) <3 mIU/mL  eGFR (CKD-EPI)  Result Value Ref Range   eGFR >60 mL/min/1.67m2   Imaging Results   None    ECG Results   None            [1] Allergies Allergen Reactions   Celery (Apium Graveolens) (Umbelliferae) Hives and Other (See Comments)   Clonazepam Other (See Comments)    Seizures  [2] Past Medical History: Diagnosis Date   Borderline personality disorder (CMS/HCC v28)    Diabetes mellitus (CMS/HCC v28)    Hypertension    Schizo-affective schizophrenia (CMS/HCC v28)   [3] No past surgical history on file. [4] Family History Problem Relation Age of Onset   Heart disease Mother    Stroke Mother    Stroke Father    Lonni Redell Paschal, MD 05/01/24 [id number]  "

## 2024-05-06 NOTE — ED Provider Notes (Signed)
 "   [facility] Emergency Department Provider Note Room: RC03/RC03  Medical Decision Making   ZAYDAH NAWABI is a 34 y.o. female presents with cold exposure.  She states her hands, feet and knees hurt due to being out in the cold.  Patient is currently unhoused.  She also reports she recently found out she was pregnant but did not move forward with the ultrasound made recommended at that time.  She denies vaginal bleeding.  Patient is unsure her last menstrual period  The differential diagnosis associated with the patients presentation includes: Pregnancy of unknown anatomic location, pregnancy of unknown gestational age, cold exposure, UTI Was Admission/Obs considered? Yes; Escalation of care including admission/observation was considered. Escalation of Care including OBS/Admission/Transfer was considered: However, patient was determined to be appropriate for outpatient management. See progress note for additional detail.  Discussion with consultants [facility], hospitalists, radiologist, etc) case management Independent interpretation of studies: Ultrasound OB shows intrauterine pregnancy with cardiac activity Review of external (non-ED) records including: 04/09/2018 [facility] general surgery encounter for chronic cholecystitis reviewed Patients care significantly limited by Social Determinants of Health including: Social Determinants of Health: Social Determinants of Health: Inadequate housing   Progress Notes   Patient was discussed with ED attending Dr. Sharie, agrees with current plan and course of treatment.   ED Course as of 05/07/24 0018  Fri May 06, 2024  2331 CBC with Differential(!) Nonactionable, WBC 7.6, hemoglobin 10.0, hematocrit 32, plate count 604  2331 CMP(!) Nonactionable  Sat May 07, 2024  0011 HCG Quantitative(!): 55,044  0011 Ultrasound shows patient is [redacted] weeks pregnant with intrauterine pregnancy with cardiac activity.  Will provide patient with [facility] OB/GYN referral,  information for the health department for prenatal care, prescription for prenatal vitamins and strict ED return precautions  0018 I spoke with case management who will assist with transportation to [facility] tonight and provide patient with shelter resources    Disposition   Clinical Impression:     Diagnosis Comment Added By Time Added   [redacted] weeks gestation of pregnancy  Joesph Norman Maxon, PA-C 05/07/2024 12:12 AM   Housing instability  Joesph Norman [place], [place] 05/07/2024 12:12 AM      Final Disposition: Discharge  History   Chief Complaint in Triage  Patient presents with   Cold Exposure    Pt states her hands knees and feet hurt badly after being in the cold. Pt also relating she was told she was pregnant last week, which she learned because blood was drawn. Pt does not currently use prenatal care and does not know her LMP.    The historian is the patient   HPI:  NYRA ANSPAUGH is a 34 y.o. female with past medical history of diabetes mellitus, hypertension, schizoaffective schizophrenia and borderline personality disorder presents with cold exposure.  She states her hands, feet and knees hurt due to being out in the cold.  Patient is currently unhoused.  She also reports she recently found out she was pregnant but did not move forward with the ultrasound made recommended at that time.  She denies vaginal bleeding.  Patient is unsure her last menstrual period  MEDICATIONS:  Patient's Medications  Previous Medications   BENZTROPINE (COGENTIN) 1 MG TABLET    Take 1 mg by mouth 2 (two) times a day.   BROMOCRIPTINE (PARLODEL) 2.5 MG TABLET    Take 2.5 mg by mouth daily.   LISINOPRIL  (ZESTRIL ) 10 MG TABLET    Take 10 mg by mouth  daily.   METFORMIN (GLUCOPHAGE) 500 MG TABLET    Take 500 mg by mouth 2 (two) times a day with meals.   PANTOPRAZOLE (PROTONIX) 40 MG TABLET    Take 40 mg by mouth daily.  Modified Medications   No medications on file    ALLERGIES:  Allergies[1]  PAST MEDICAL HISTORY:  Past Medical History[2]  PAST SURGICAL HISTORY: Past Surgical History[3]  SOCIAL HISTORY:  Social History   Tobacco Use   Smoking status: Never   Smokeless tobacco: Not on file  Substance Use Topics   Alcohol use: No     FAMILY HISTORY:  Family History[4]  Physical Exam    ED Triage Vitals [05/06/24 2057]  Temp 98.5 F (36.9 C)  Temp Source Oral  Heart Rate 87  BP (!) 142/92  Respirations 15  SpO2 99 %   Reviewed vital signs and nursing note as charted by RN. Physical Exam Vitals and nursing note reviewed. Exam conducted with a chaperone present.  Constitutional:      General: She is not in acute distress.    Appearance: Normal appearance. She is obese. She is not ill-appearing.  HENT:     Head: Normocephalic and atraumatic.     Nose: Nose normal.  Eyes:     General: No scleral icterus.    Extraocular Movements: Extraocular movements intact.     Conjunctiva/sclera: Conjunctivae normal.     Pupils: Pupils are equal, round, and reactive to light.  Cardiovascular:     Rate and Rhythm: Normal rate and regular rhythm.     Pulses: Normal pulses.     Heart sounds: Normal heart sounds. No murmur heard.    No friction rub. No gallop.  Pulmonary:     Effort: Pulmonary effort is normal. No respiratory distress.     Breath sounds: Normal breath sounds. No wheezing, rhonchi or rales.  Abdominal:     General: Bowel sounds are normal. There is no distension.     Palpations: Abdomen is soft. There is no mass.     Tenderness: There is no abdominal tenderness. There is no guarding or rebound.     Hernia: No hernia is present.  Musculoskeletal:        General: No swelling, tenderness, deformity or signs of injury. Normal range of motion.     Cervical back: Normal range of motion. No rigidity.  Skin:    General: Skin is warm and dry.     Capillary Refill: Capillary refill takes less than 2 seconds.     Coloration: Skin is not  jaundiced.     Findings: No bruising, erythema, lesion or rash.  Neurological:     General: No focal deficit present.     Mental Status: She is alert and oriented to person, place, and time. Mental status is at baseline.     Cranial Nerves: No cranial nerve deficit.     Sensory: No sensory deficit.     Motor: No weakness.     Gait: Gait normal.  Psychiatric:        Mood and Affect: Mood normal.        Behavior: Behavior normal.        Thought Content: Thought content normal.        Judgment: Judgment normal.         Results    I personally reviewed the results in the chart as listed below  Results for orders placed or performed during the hospital encounter of 05/06/24  hCG,  Quantitative  Result Value Ref Range   HCG Quantitative 55,044 (H) <3 mIU/mL  CBC with Differential  Result Value Ref Range   Differential Percent Diff %    Differential Absolute Diff Absolute    WBC 7.6 3.6 - 11.2 K/uL   RBC 4.07 3.63 - 4.92 M/uL   Hemoglobin 10.0 (L) 10.9 - 14.3 g/dL   Hematocrit 32 31 - 42 %   Mean Cell Volume 79 74 - 96 fL   Mean Cell Hemoglobin 25 24 - 33 pg   Mean Cell Hemoglobin Concentration 31 (L) 33 - 36 g/dL   RDW 82.8 (H) 87.6 - 82.9 %   Platelet Count 395 150 - 450 K/uL   Mean Platelet Volume 8.1 7.5 - 11.2 fL   Neutrophils 69 43 - 77 %   Lymphocytes 22 16 - 44 %   Monocytes 8 5 - 13 %   Eosinophils 1 1 - 8 %   Basophils 0 0 - 1 %   Neutrophils Absolute 5.2 1.8 - 7.8 K/uL   Lymphocytes Absolute 1.7 1.0 - 3.0 K/uL   Monocytes Absolute 0.6 0.3 - 1.0 K/uL   Eosinophils Absolute 0.1 0.0 - 0.5 K/uL   Basophils Absolute 0.0 0.0 - 0.1 K/uL   Nucleated RBCS 0 /100 WBC  CMP  Result Value Ref Range   Sodium 136 136 - 145 mmol/L   Potassium 4.3 3.5 - 5.1 mmol/L   Chloride 104 98 - 107 mmol/L   CO2 23 21 - 31 mmol/L   BUN 8 7 - 25 mg/dL   Creatinine 9.39 9.39 - 1.20 mg/dL   Glucose, Random 897 70 - 199 mg/dL   Calcium, Total 9.4 8.6 - 10.3 mg/dL   Osmolality  (calculated) 270 270 - 295 mOsm/kg   Anion Gap 9 4 - 12   Albumin 3.4 (L) 3.5 - 5.7 g/dL   Bilirubin, Total 0.3 0.3 - 1.0 mg/dL   Alkaline Phosphatase 72 34 - 104 IU/L   ALT 8 7 - 52 IU/L   AST 13 13 - 39 IU/L   Protein, Total 6.7 6.4 - 8.9 g/dL   Albumin/Globulin Ratio 1.0 (L) 1.2 - 2.3  eGFR (CKD-EPI)  Result Value Ref Range   eGFR 119 mL/min/1.64m2   Imaging Results          US  OB Limited (Final result)  Result time 05/07/24 00:05:30  Procedure changed from US  OB First Trimester Pelvis and Endovaginal   Final result by Norleen Prentice Backers, MD (05/07/24 00:05:30)             Impression:   IMPRESSION: 1.  Gerri live intrauterine pregnancy with measurements corresponding to gestational age of [redacted] weeks 2 days and estimated date of confinement of October 26, 2024  Notes: A fetal survey was not performed as part of this examination. This study is not diagnostic for fetal anatomy and a separate fetal survey study needs to be performed if clinically necessary.           Narrative:   LIMITED OBSTETRIC ULTRASOUND:  HISTORY: pregnancy unknown anatomic location Beta HCG: 59,350  COMPARISON: None  FINDINGS:  The fetus is variable in position.  The fetal heart rate is 160 bpm. Estimated fetal weight: 111 grams. Estimated gestational age (based on measurements):  15 wks 2 days  The amniotic fluid volume is normal subjectively The placenta is anterior. There is no placenta previa or abruption.    Ovaries not visualized.  ECG Results   None          Joesph Norman Maxon, [place] 05/07/24 0018      [1] Allergies Allergen Reactions   Celery (Apium Graveolens) (Umbelliferae) Hives and Other (See Comments)   Clonazepam Other (See Comments)    Seizures  [2] Past Medical History: Diagnosis Date   Borderline personality disorder (CMS/HCC v28)    Diabetes mellitus    Hypertension    Schizo-affective schizophrenia  (CMS/HCC v28)   [3] History reviewed. No pertinent surgical history. [4] Family History Problem Relation Age of Onset   Heart disease Mother    Stroke Mother    Stroke Father   "

## 2024-05-07 NOTE — ED Provider Notes (Signed)
 SUPERVISING PHYSICIAN NOTE: I have personally evaluated this patient and discussed the patient's evaluation and management as documented by the Advanced Practice Provider. I personally made/approved the management plan and take responsibility for the patient management.   My findings confirm, and/or supplement their note as follows:      HPI Summary: Tracie Hanson is a 34 y.o. female unhoused, history of diabetes, underlying psychiatric disorder and pregnant presents to the emergency department due to cold exposure.  Reports her hands and feet are cold.  She not complaining of any pain at this time no chest pain, no abdominal pain, no vaginal bleeding.   Exam Summary: Patient is awake and alert and oriented.  Abdomen is soft and nontender, lungs are clear to auscultation.  She is warm and well-perfused.  Clinical Impression/Medical Decision Making: Temperature in the emergency department 98.5, CBC and CMP are nonactionable, limited OB ultrasound shows a 15-week IUP with normal cardiac activity.  Plan to discharge with outpatient OB/GYN, and prenatal vitamins.  Patient has been able to tolerate p.o. in the emergency department.     MDM Elements

## 2024-05-07 NOTE — Care Plan (Signed)
 Patient is ready for discharge; asking to go to a shelter. Spoke with patient at bedside; introduced self and explained role in ED. Patient provided community resources, socks, gloves and jacket. [facility] [address]      05/07/24 0039  Emergency Department Intervention  Service Provided by: Nurse Case Manager  Was Patient Admitted? No  Shift Weekend Night  Referral Source Staff ED RN  ED LOS Increase No  Transportation Assistance Provided:  ([facility])  Support, Education, and Other Create Discharge plan with patient, family or significant other-note  CM ED Other  Case Management evaluation ED Complete

## 2024-05-17 ENCOUNTER — Other Ambulatory Visit: Payer: Self-pay

## 2024-05-17 ENCOUNTER — Emergency Department: Admission: EM | Admit: 2024-05-17 | Discharge: 2024-05-20 | Disposition: A | Discharge status: Home / Self Care

## 2024-05-17 ENCOUNTER — Emergency Department

## 2024-05-17 DIAGNOSIS — Z91148 Patient's other noncompliance with medication regimen for other reason: Secondary | ICD-10-CM | POA: Diagnosis not present

## 2024-05-17 DIAGNOSIS — Z3492 Encounter for supervision of normal pregnancy, unspecified, second trimester: Secondary | ICD-10-CM

## 2024-05-17 DIAGNOSIS — O24912 Unspecified diabetes mellitus in pregnancy, second trimester: Secondary | ICD-10-CM | POA: Insufficient documentation

## 2024-05-17 DIAGNOSIS — R030 Elevated blood-pressure reading, without diagnosis of hypertension: Secondary | ICD-10-CM

## 2024-05-17 DIAGNOSIS — O99342 Other mental disorders complicating pregnancy, second trimester: Secondary | ICD-10-CM | POA: Insufficient documentation

## 2024-05-17 DIAGNOSIS — R454 Irritability and anger: Secondary | ICD-10-CM | POA: Insufficient documentation

## 2024-05-17 DIAGNOSIS — X58XXXA Exposure to other specified factors, initial encounter: Secondary | ICD-10-CM | POA: Insufficient documentation

## 2024-05-17 DIAGNOSIS — O0932 Supervision of pregnancy with insufficient antenatal care, second trimester: Secondary | ICD-10-CM | POA: Diagnosis not present

## 2024-05-17 DIAGNOSIS — F22 Delusional disorders: Secondary | ICD-10-CM | POA: Insufficient documentation

## 2024-05-17 DIAGNOSIS — O10012 Pre-existing essential hypertension complicating pregnancy, second trimester: Secondary | ICD-10-CM | POA: Insufficient documentation

## 2024-05-17 DIAGNOSIS — S0081XA Abrasion of other part of head, initial encounter: Secondary | ICD-10-CM | POA: Diagnosis not present

## 2024-05-17 DIAGNOSIS — O9A212 Injury, poisoning and certain other consequences of external causes complicating pregnancy, second trimester: Secondary | ICD-10-CM | POA: Insufficient documentation

## 2024-05-17 DIAGNOSIS — L989 Disorder of the skin and subcutaneous tissue, unspecified: Secondary | ICD-10-CM | POA: Diagnosis not present

## 2024-05-17 DIAGNOSIS — Z3A17 17 weeks gestation of pregnancy: Secondary | ICD-10-CM | POA: Insufficient documentation

## 2024-05-17 DIAGNOSIS — F419 Anxiety disorder, unspecified: Secondary | ICD-10-CM | POA: Insufficient documentation

## 2024-05-17 DIAGNOSIS — F259 Schizoaffective disorder, unspecified: Secondary | ICD-10-CM | POA: Insufficient documentation

## 2024-05-17 DIAGNOSIS — L299 Pruritus, unspecified: Secondary | ICD-10-CM | POA: Diagnosis not present

## 2024-05-17 DIAGNOSIS — Z79899 Other long term (current) drug therapy: Secondary | ICD-10-CM | POA: Insufficient documentation

## 2024-05-17 DIAGNOSIS — O99212 Obesity complicating pregnancy, second trimester: Secondary | ICD-10-CM | POA: Insufficient documentation

## 2024-05-17 DIAGNOSIS — I10 Essential (primary) hypertension: Secondary | ICD-10-CM | POA: Insufficient documentation

## 2024-05-17 DIAGNOSIS — F29 Unspecified psychosis not due to a substance or known physiological condition: Secondary | ICD-10-CM

## 2024-05-17 DIAGNOSIS — E119 Type 2 diabetes mellitus without complications: Secondary | ICD-10-CM | POA: Insufficient documentation

## 2024-05-17 LAB — URINE DRUG SCREEN
Amphetamines: NEGATIVE
Barbiturates: NEGATIVE
Benzodiazepines: NEGATIVE
Cocaine: NEGATIVE
Fentanyl: NEGATIVE
Methadone Scn, Ur: NEGATIVE
Opiates: NEGATIVE
Tetrahydrocannabinol: NEGATIVE

## 2024-05-17 LAB — CBC
HCT: 28.1 % — ABNORMAL LOW (ref 36.0–46.0)
Hemoglobin: 8.6 g/dL — ABNORMAL LOW (ref 12.0–15.0)
MCH: 24.2 pg — ABNORMAL LOW (ref 26.0–34.0)
MCHC: 30.6 g/dL (ref 30.0–36.0)
MCV: 79.2 fL — ABNORMAL LOW (ref 80.0–100.0)
Platelets: 403 10*3/uL — ABNORMAL HIGH (ref 150–400)
RBC: 3.55 MIL/uL — ABNORMAL LOW (ref 3.87–5.11)
RDW: 15.9 % — ABNORMAL HIGH (ref 11.5–15.5)
WBC: 9.5 10*3/uL (ref 4.0–10.5)
nRBC: 0 % (ref 0.0–0.2)

## 2024-05-17 LAB — URINALYSIS, ROUTINE W REFLEX MICROSCOPIC
Bilirubin Urine: NEGATIVE
Glucose, UA: NEGATIVE mg/dL
Ketones, ur: NEGATIVE mg/dL
Leukocytes,Ua: NEGATIVE
Nitrite: NEGATIVE
Protein, ur: >=300 mg/dL — AB
Specific Gravity, Urine: 1.014 (ref 1.005–1.030)
pH: 5 (ref 5.0–8.0)

## 2024-05-17 LAB — TYPE AND SCREEN
ABO/RH(D): O POS
Antibody Screen: NEGATIVE

## 2024-05-17 LAB — COMPREHENSIVE METABOLIC PANEL WITH GFR
ALT: 6 U/L (ref 0–44)
AST: 18 U/L (ref 15–41)
Albumin: 3.3 g/dL — ABNORMAL LOW (ref 3.5–5.0)
Alkaline Phosphatase: 83 U/L (ref 38–126)
Anion gap: 13 (ref 5–15)
BUN: <5 mg/dL — ABNORMAL LOW (ref 6–20)
CO2: 21 mmol/L — ABNORMAL LOW (ref 22–32)
Calcium: 9.4 mg/dL (ref 8.9–10.3)
Chloride: 102 mmol/L (ref 98–111)
Creatinine, Ser: 0.61 mg/dL (ref 0.44–1.00)
GFR, Estimated: >60 mL/min
Glucose, Bld: 90 mg/dL (ref 70–99)
Potassium: 3.7 mmol/L (ref 3.5–5.1)
Sodium: 136 mmol/L (ref 135–145)
Total Bilirubin: 0.4 mg/dL (ref 0.0–1.2)
Total Protein: 7 g/dL (ref 6.5–8.1)

## 2024-05-17 LAB — HEPATITIS B SURFACE ANTIGEN: Hepatitis B Surface Ag: NONREACTIVE

## 2024-05-17 LAB — PROTEIN / CREATININE RATIO, URINE
Creatinine, Urine: 148 mg/dL
Protein Creatinine Ratio: 2.5 mg/mg — ABNORMAL HIGH
Total Protein, Urine: 369 mg/dL

## 2024-05-17 LAB — HEMOGLOBIN A1C
Hgb A1c MFr Bld: 5.9 % — ABNORMAL HIGH (ref 4.8–5.6)
Mean Plasma Glucose: 122.63 mg/dL

## 2024-05-17 LAB — CHLAMYDIA/NGC RT PCR (ARMC ONLY)
Chlamydia Tr: NOT DETECTED
N gonorrhoeae: NOT DETECTED

## 2024-05-17 LAB — ETHANOL: Alcohol, Ethyl (B): <15 mg/dL

## 2024-05-17 LAB — TSH: TSH: 2.8 u[IU]/mL (ref 0.350–4.500)

## 2024-05-17 LAB — HCG, QUANTITATIVE, PREGNANCY: hCG, Beta Chain, Quant, S: 24684 m[IU]/mL — ABNORMAL HIGH

## 2024-05-17 MED ORDER — LABETALOL HCL 5 MG/ML IV SOLN: 40.0000 mg | INTRAVENOUS | Status: DC | PRN

## 2024-05-17 MED ORDER — NIFEDIPINE 10 MG PO CAPS: 20.0000 mg | ORAL_CAPSULE | ORAL | Status: DC | PRN

## 2024-05-17 MED ORDER — DIPHENHYDRAMINE HCL 50 MG/ML IJ SOLN
50.0000 mg | Freq: Once | INTRAMUSCULAR | Status: AC
  Administered 2024-05-17: 50 mg via INTRAMUSCULAR
  Filled 2024-05-17: qty 1

## 2024-05-17 MED ORDER — HALOPERIDOL LACTATE 5 MG/ML IJ SOLN
5.0000 mg | Freq: Once | INTRAMUSCULAR | Status: AC
  Administered 2024-05-17: 5 mg via INTRAMUSCULAR
  Filled 2024-05-17: qty 1

## 2024-05-17 MED ORDER — LABETALOL HCL 5 MG/ML IV SOLN
20.0000 mg | INTRAVENOUS | Status: DC | PRN
  Filled 2024-05-17: qty 4

## 2024-05-17 MED ORDER — LABETALOL HCL 5 MG/ML IV SOLN: 80.0000 mg | INTRAVENOUS | Status: DC | PRN

## 2024-05-17 MED ORDER — NIFEDIPINE 10 MG PO CAPS: 10.0000 mg | ORAL_CAPSULE | ORAL | Status: DC | PRN

## 2024-05-17 MED ORDER — HYDRALAZINE HCL 20 MG/ML IJ SOLN: 10.0000 mg | INTRAMUSCULAR | Status: DC | PRN

## 2024-05-17 NOTE — ED Notes (Signed)
 RN attempt to try and get blood work and EKG at this time. Pt screams and starts shaking and crying in bed. Pt unable to receive education and provider made aware.

## 2024-05-17 NOTE — BH Assessment (Signed)
 Comprehensive Clinical Assessment (CCA) Screening, Triage and Referral Note  05/17/2024 Tracie Hanson [id number]  Tracie Hanson, 34 year old female who presents to [facility] ED involuntarily for treatment. Per triage note, Pt sts that she is having a skin condition and that is goes deeper then you can see. Pt sts that she has been in and out of psych hospitals and that is not it. I am having problems with my skin. Pt has what appears to be small round scabs on her face and arms. Pt sts that she is pregnant too. Pt denies any SI/HI.   During TTS assessment, pt presents alert and oriented x 4, restless but cooperative, and mood-congruent with affect. The pt does not appear to be responding to internal or external stimuli. Neither is the pt presenting with any delusional thinking. Pt verified the information provided to triage RN.   Pt identifies her main complaint to be  I came here for my skin. Patient reports since September 2025, her skin has been itching and burning intensely. I have tried and nothing seems to be working. Patient is [redacted] weeks pregnant and says she has not taken her BP or diabetes medication. Patient reports she is not taking any psych medications at this time either. Patient presents anxious, tearful and paranoid. Patient says she is constantly being referred to psych every time she comes to the hospital. Patient states she is her own legal guardian and able to make her own decisions. I have rights. Pt denies current SI/HI/AH. Patient reports she has been experiencing VH since September 2025 but did not want to discuss in detail as she would automatically be referred to an inpatient hospital. Pt contracts for safety. Patient provided her friend, Tracie Hanson as a collateral contact. [phone].   Per Zelda, NP: Collateral from [facility] (patient gave permission; collateral requested information not be shared with patient due to risk to therapeutic relationship): Patient reached out to [facility]  on 1/17 stating she had no place to go in freezing temperatures. Approved to stay at one of Tracie Hanson's support living houses. Five years ago, patient resided there but was removed due to behaviors including throwing plates and chasing residents; Tracie Hanson notes vast improvement in these behaviors. Patient disclosed to Tracie Hanson that she stopped all medications, attempted to get pregnant, and will not take medications due to fear of harm during pregnancy. Tracie Hanson reports noticing patient looking past people or appearing to respond to internal stimuli and feels patient needs medications. Patient previously had court-ordered legal guardianship but regained own guardianship in 2022; Tracie Hanson voiced concerns about this decision. Reported that patient requires gentle approach as she becomes defensive and upset easily. Tracie Hanson expressed concerns regarding patient's current rationale and reasoning.   Collateral information from Tracie Hanson communicated to primary medical team; per collateral source request, information NOT to be shared with patient to preserve therapeutic relationship and trust.    Per Zelda, NP, pt is recommended for inpatient psychiatric admission.  Chief Complaint:  Chief Complaint  Patient presents with   Abrasion   Visit Diagnosis: Schizoaffective disorder  Patient Reported Information How did you hear about us ? Self  What Is the Reason for Your Visit/Call Today? Patient reports she came to ED due to skin condition.  How Long Has This Been Causing You Problems? > than 6 months  What Do You Feel Would Help You the Most Today? -- (Assessment only)   Have You Recently Had Any Thoughts About Hurting Yourself? No  Are You Planning to Commit Suicide/Harm  Yourself At This time? No   Have you Recently Had Thoughts About Hurting Someone Sherral? No  Are You Planning to Harm Someone at This Time? No  Explanation: No data recorded  Have You Used Any Alcohol or Drugs in the Past 24 Hours? No  How  Long Ago Did You Use Drugs or Alcohol? No data recorded What Did You Use and How Much? No data recorded  Do You Currently Have a Therapist/Psychiatrist? No  Name of Therapist/Psychiatrist: No data recorded  Have You Been Recently Discharged From Any Office Practice or Programs? No  Explanation of Discharge From Practice/Program: No data recorded   CCA Screening Triage Referral Assessment Type of Contact: Face-to-Face  Telemedicine Service Delivery:   Is this Initial or Reassessment?   Date Telepsych consult ordered in [facility]:    Time Telepsych consult ordered in [facility]:    Location of Assessment: [facility] ED  Provider Location: [facility] ED    Collateral Involvement: [id number]   Does Patient Have a Court Appointed Legal Guardian? No data recorded Name and Contact of Legal Guardian: No data recorded If Minor and Not Living with Parent(s), Who has Custody? No data recorded Is CPS involved or ever been involved? No data recorded Is APS involved or ever been involved? No data recorded  Patient Determined To Be At Risk for Harm To Self or Others Based on Review of Patient Reported Information or Presenting Complaint? No  Method: No Plan  Availability of Means: No access or NA  Intent: Vague intent or NA  Notification Required: No need or identified person  Additional Information for Danger to Others Potential: No data recorded Additional Comments for Danger to Others Potential: No data recorded Are There Guns or Other Weapons in Your Home? No data recorded Types of Guns/Weapons: No data recorded Are These Weapons Safely Secured?                            No data recorded Who Could Verify You Are Able To Have These Secured: No data recorded Do You Have any Outstanding Charges, Pending Court Dates, Parole/Probation? No data recorded Contacted To Inform of Risk of Harm To Self or Others: No data recorded  Does Patient Present under Involuntary Commitment? Yes    [place] of  Residence: [place]   Patient Currently Receiving the Following Services: Group Home   Determination of Need: Emergent (2 hours)   Options For Referral: ED Visit; Medication Management; Intensive Outpatient Therapy; Inpatient Hospitalization; Group Home   Disposition Recommendation per psychiatric provider: Per Zelda, NP, pt is recommended for inpatient psychiatric admission.  Orah Sonnen JONELLE Dolly, Counselor, LCAS-A

## 2024-05-17 NOTE — ED Notes (Addendum)
 RN attempted to get IV at this time. Pt refused after multiple attempts to educate pt. Provider comes over and able to persuade pt to get IV. RN attempts but pt reacts and pulls back. Pt starts slapping her skin and itching her neck. Pt keeps stating over and over again I can't, I won't, I can't. RN attempts to calm pt down and educate her necessity of IV for pt. Pt keeps looking back at wall and staring at pillow. RN ask pt is she is seeing anything. Pt denies any visual hallucinations. Pt does not respond and continues shaking head. Provider attempts to speak to pt and pt continues to be nonverbal and shake head. Team was able to convince pt to go to US  at this time. Pt still refusing IV at this time.

## 2024-05-17 NOTE — Consult Note (Signed)
 [facility] Psychiatric Consult Initial  Patient Name: .Tracie Hanson  MRN: [id number]  DOB: 1990-05-07  Consult Order details:  Orders (From admission, onward)     Start     Ordered   05/17/24 1220  CONSULT TO CALL ACT TEAM       Ordering Provider: Nicholaus Rolland BRAVO, MD  Provider:  (Not yet assigned)  Question:  Reason for Consult?  Answer:  Psych consult   05/17/24 1220   05/17/24 1220  IP CONSULT TO PSYCHIATRY       Ordering Provider: Nicholaus Rolland BRAVO, MD  Provider:  (Not yet assigned)  Question:  Reason for consult:  Answer:  Medication management   05/17/24 1220             Mode of Visit: In person    Psychiatry Consult Evaluation  Service Date: May 17, 2024 LOS:  LOS: 0 days  Chief Complaint My skin is being stupid  Primary Psychiatric Diagnoses  Schizoaffective disorder [facility])   Assessment  CLINICAL DECISION MAKING:  Patient with psychiatric history (schizoaffective disorder noted in chart) presenting with concerns of skin condition, likely visual hallucinations since September, paranoia, impaired reasoning, and appearing to respond to internal stimuli. Currently [redacted] weeks pregnant without prenatal care. Non-adherent with all medications including psychotropics, diabetes, and blood pressure medications. Risk to self due to medication non-adherence, impaired judgment, and paranoid presentation with psychotic symptoms.  PLAN:  IVC upheld due to risk to self from medication non-adherence (psychotropic, diabetes, and blood pressure medications), impaired reasoning and judgment, paranoid presentation with psychotic symptoms, and lack of prenatal care despite [redacted] weeks gestation   After medical clearance, recommend inpatient psychiatric admission for medication stabilization, establishment of prenatal care, management of chronic medical conditions, and safety monitoring  Diagnoses:  Active Hospital problems: Active Problems:   Schizoaffective disorder (HCC)     Plan   ## Disposition: Patient currently undergoing medical evaluation/workup, after medical clearance will be recommended for inpatient psychiatric admission.  Due to patient current presentation, psychiatry does recommend to uphold IVC at this time.  ## Psychiatric Medication Recommendations: Haldol  and Benadryl  PRN for agitation/psychosis only, limited use given 17-week pregnancy; use restricted to situations where benefits outweigh risks to maintain patient and staff safety  *In a pregnant patient with acute psychosis, paranoia, impaired reasoning, refusing medical care (including prenatal care), and medication non-adherence with chronic medical conditions (diabetes, hypertension), the immediate risk of uncontrolled psychiatric illness outweighs the relatively well-studied risks of limited PRN Haldol  and Benadryl  use. This combination provides the longest safety track record in pregnancy for managing acute agitation and psychosis, while the PRN-only approach minimizes fetal exposure.*  Defer further psychotropic medication management to inpatient psychiatric unit in collaboration with OB/GYN team given pregnancy   ## Medical Decision Making Capacity: Not specifically addressed in this encounter  ## Further Work-up:  EKG- QTC: Pending Labs: Pending  ## Behavioral / Environmental: -Utilize compassion and acknowledge the patient's experiences while setting clear and realistic expectations for care.    ## Safety and Observation Level:  - Based on my clinical evaluation, I estimate the patient to be at low risk of self harm in the current setting. - At this time, we recommend  routine. This decision is based on my review of the chart including patient's history and current presentation, interview of the patient, mental status examination, and consideration of suicide risk including evaluating suicidal ideation, plan, intent, suicidal or self-harm behaviors, risk factors, and protective  factors. This judgment is based  on our ability to directly address suicide risk, implement suicide prevention strategies, and develop a safety plan while the patient is in the clinical setting. Please contact our team if there is a concern that risk level has changed.  CSSR Risk Category:C-SSRS RISK CATEGORY:  Flowsheet Row ED from 05/17/2024 in [facility] Emergency Department at [facility] ED to Hosp-Admission (Discharged) from 03/19/2018 in [facility] ED from 01/07/2018 in [facility] Emergency Department at [facility]  C-SSRS RISK CATEGORY No Risk No Risk Moderate Risk     Suicide Risk Assessment: Patient has following modifiable risk factors for suicide: medication noncompliance, lack of access to outpatient mental health resources, active mental illness (to encompass adhd, tbi, mania, psychosis, trauma reaction), and triggering events, which we are addressing by recommending inpatient psychiatric admission following medical stabilization/clearance.  Patient has following non-modifiable or demographic risk factors for suicide: psychiatric hospitalization  Patient has the following protective factors against suicide: Supportive friends and pregnancy  Thank you for this consult request. Recommendations have been communicated to the primary team.  We will sign off at this time.       History of Present Illness  Relevant Aspects of Hospital ED   Patient Report:   SUBJECTIVE: Patient presented to emergency department with concerns of having a skin condition that is deeper than you can see. Reports skin is stupid, itching and burning and all kinds of things but would not elaborate further, stating she would end up stuck here. Reports ongoing since September with no relief from treatments. Denies suicidal or homicidal ideations. When asked about auditory or visual hallucinations, patient loudly stated probably regarding visual  hallucinations but refused to elaborate, reports ongoing since September as well. Reports doing fine. Currently staying in [place]. Confirmed that she is currently her own legal guardian. Gave permission for psychiatry to contact friend Latoya.  Reports not taking previously prescribed psychotropic medications. Not taking diabetes or blood pressure medications. Patient is [redacted] weeks pregnant and has received no outpatient OB/GYN care.  Collateral from [facility] (patient gave permission; collateral requested information not be shared with patient due to risk to therapeutic relationship): Patient reached out to [facility] on 1/17 stating she had no place to go in freezing temperatures. Approved to stay at one of Latoya's support living houses. Five years ago, patient resided there but was removed due to behaviors including throwing plates and chasing residents; Latoya notes vast improvement in these behaviors. Patient disclosed to Latoya that she stopped all medications, attempted to get pregnant, and will not take medications due to fear of harm during pregnancy. Latoya reports noticing patient looking past people or appearing to respond to internal stimuli and feels patient needs medications. Patient previously had court-ordered legal guardianship but regained own guardianship in 2022; Latoya voiced concerns about this decision. Reported that patient requires gentle approach as she becomes defensive and upset easily. Latoya expressed concerns regarding patient's current rationale and reasoning.  OBJECTIVE: Patient irritable, defensive, and tearful on exam. When psychiatry team introduced self, patient loudly stated See this is why I don't come anywhere always end up in the psych ward and became tearful. Appears very paranoid and anxious. Displaying impaired rationalization and reasoning. Example of impaired reasoning: I understand, but I can't when asked to provide blood sample for further medical evaluation,  despite extensive explanation of need. Nursing staff reported patient looking at things that were not there and patient would not elaborate when asked. Exam limited due to patient's current  status and refusal to engage fully. Patient refused blood sample, became irritable and tearful with impaired reasoning when persuaded.  Collateral information from Latoya communicated to primary medical team; per collateral source request, information not to be shared with patient to preserve therapeutic relationship and trust  Psychiatric and Social History  Psychiatric History:  Information collected from patient/chart  Prev Dx/Sx: Schizoaffective disorder documented in chart Current Psych Provider: None currently Home Meds (current): Patient denied taking any current medications Previous Med Trials: Invega and Seroquel  listed in chart Therapy: Denied Prior Psych Hospitalization: Yes Prior Suicide attempt/Self Harm: Documented history of suicidal ideations Prior Violence: Documented history of aggressive behavior  Family Psych History: Unknown Family Hx suicide: Unknown  Social History:   Occupational Hx: Employed Legal Hx: Denied Living Situation: Patient just recently transition to living in supportive living house on January 17  Access to weapons/lethal means: Patient denied  Substance History Alcohol: Denied Tobacco: Denied Illicit drugs: Denied Prescription drug abuse: Denied Rehab hx: Denied  Exam Findings  Physical Exam: Reviewed and agree with the physical exam findings conducted by the medical provider Vital Signs:  Temp:  [98.7 F (37.1 C)] 98.7 F (37.1 C) (01/27 1059) Pulse Rate:  [92] 92 (01/27 1059) Resp:  [17] 17 (01/27 1059) BP: (177)/(100) 177/100 (01/27 1059) SpO2:  [98 %] 98 % (01/27 1059) Weight:  [108.9 kg] 108.9 kg (01/27 1059) Blood pressure (!) 177/100, pulse 92, temperature 98.7 F (37.1 C), temperature source Oral, resp. rate 17, height 5' 1 (1.549 m),  weight 108.9 kg, SpO2 98%. Body mass index is 45.35 kg/m.    Mental Status Exam: General Appearance: Disheveled  Orientation:  Full (Time, Place, and Person)  Memory:  Fair  Concentration:  Concentration: Poor  Recall:  Fair  Attention  Poor  Eye Contact:  Fair  Speech:  Clear and Coherent  Language:  Fair  Volume:  Increased  Mood: Irritable  Affect:  Labile  Thought Process:  UTA  Thought Content:  Hallucinations: Visual  Suicidal Thoughts:  No  Homicidal Thoughts:  No  Judgement:  Poor  Insight:  Lacking  Psychomotor Activity:  Normal  Akathisia:  No  Fund of Knowledge:  UTA      Assets:  Housing Social Support  Cognition:  Impaired  ADL's:  Intact  AIMS (if indicated):        Other History   These have been pulled in through the EMR, reviewed, and updated if appropriate.  Family History:  The patient's family history is not on file.  Medical History: Past Medical History:  Diagnosis Date   Anemia    Antisocial personality disorder (HCC)    Bipolar 1 disorder (HCC)    Diabetes mellitus without complication (HCC)    GERD (gastroesophageal reflux disease)    Hyperprolactinemia    Hypertension    Intellectual disability    Narcissistic personality disorder (HCC)    Normocytic anemia    Personality disorder (HCC)    Schizoaffective disorder (HCC)     Surgical History: Past Surgical History:  Procedure Laterality Date   COLPOSCOPY       Medications:  Current Medications[1]  Allergies: Allergies[2]  A. Claudene, NP This note was created using Scientist, clinical (histocompatibility and immunogenetics). Please excuse any inadvertent transcription errors. Case was discussed with supervising physician Dr. Jadapalle who is agreeable with current plan.        [1]  Current Facility-Administered Medications:    diphenhydrAMINE  (BENADRYL ) injection 50 mg, 50 mg, Intramuscular, Once, Davis, [address]  [address], MD   haloperidol  lactate (HALDOL ) injection 5 mg, 5 mg,  Intramuscular, Once, [place], Rolland BRAVO, MD   labetalol  (NORMODYNE ) injection 20 mg, 20 mg, Intravenous, PRN **AND** labetalol  (NORMODYNE ) injection 40 mg, 40 mg, Intravenous, PRN **AND** labetalol  (NORMODYNE ) injection 80 mg, 80 mg, Intravenous, PRN **AND** hydrALAZINE  (APRESOLINE ) injection 10 mg, 10 mg, Intravenous, PRN **AND** Measure blood pressure, , , Once, Verdon Keen, MD   NIFEdipine  (PROCARDIA ) capsule 10 mg, 10 mg, Oral, PRN **AND** NIFEdipine  (PROCARDIA ) capsule 20 mg, 20 mg, Oral, PRN **AND** NIFEdipine  (PROCARDIA ) capsule 20 mg, 20 mg, Oral, PRN **AND** labetalol  (NORMODYNE ) injection 40 mg, 40 mg, Intravenous, PRN **AND** Measure blood pressure, , , Once, Myron Nest, CNM  Current Outpatient Medications:    albuterol  (PROVENTIL  HFA;VENTOLIN  HFA) 108 (90 Base) MCG/ACT inhaler, Inhale 2 puffs into the lungs every 6 (six) hours as needed for wheezing or shortness of breath., Disp: , Rfl:    acetaminophen  (TYLENOL ) 650 MG CR tablet, Take 650 mg by mouth every 8 (eight) hours as needed for pain. (Patient not taking: Reported on 05/17/2024), Disp: , Rfl:    alum & mag hydroxide-simeth (MAALOX/MYLANTA) 200-200-20 MG/5ML suspension, Take 30 mLs by mouth every 6 (six) hours as needed for indigestion or heartburn. (Patient not taking: Reported on 05/17/2024), Disp: , Rfl:    budesonide-formoterol  (SYMBICORT) 160-4.5 MCG/ACT inhaler, Inhale 2 puffs into the lungs 2 (two) times daily. (Patient not taking: Reported on 05/17/2024), Disp: , Rfl:    divalproex  (DEPAKOTE  SPRINKLE) 125 MG capsule, Take 16 capsules (2,000 mg total) by mouth at bedtime. (Patient not taking: Reported on 05/17/2024), Disp: 480 capsule, Rfl: 0   divalproex  (DEPAKOTE ) 500 MG DR tablet, Take 1,500 mg by mouth 2 (two) times daily. (Patient not taking: Reported on 05/17/2024), Disp: , Rfl:    hydrochlorothiazide  (HYDRODIURIL ) 25 MG tablet, Take 12.5 mg by mouth daily. (Patient not taking: Reported on 05/17/2024), Disp: ,  Rfl:    HYDROcodone -acetaminophen  (NORCO/VICODIN) 5-325 MG tablet, Take 1 tablet by mouth every 6 (six) hours as needed for severe pain. (Patient not taking: Reported on 05/17/2024), Disp: 10 tablet, Rfl: 0   hydrOXYzine  (ATARAX /VISTARIL ) 25 MG tablet, Take 25 mg by mouth every 6 (six) hours as needed. (Patient not taking: Reported on 05/17/2024), Disp: , Rfl:    hydrOXYzine  (VISTARIL ) 25 MG capsule, Take 25 mg by mouth 3 (three) times daily as needed. (Patient not taking: Reported on 05/17/2024), Disp: , Rfl:    ibuprofen (ADVIL,MOTRIN) 400 MG tablet, Take 400 mg by mouth every 6 (six) hours as needed for moderate pain. (Patient not taking: Reported on 05/17/2024), Disp: , Rfl:    Levonorgest-Eth Estrad-Fe Bisg (BALCOLTRA) 0.1-20 MG-MCG(21) TABS, Take 1 tablet by mouth daily. (Patient not taking: Reported on 05/17/2024), Disp: , Rfl:    lisinopril  (PRINIVIL ,ZESTRIL ) 40 MG tablet, Take 40 mg by mouth daily. (Patient not taking: Reported on 05/17/2024), Disp: , Rfl:    Melatonin 3 MG TABS, Take 6 mg by mouth at bedtime. (Patient not taking: Reported on 05/17/2024), Disp: , Rfl:    metFORMIN (GLUCOPHAGE) 1000 MG tablet, Take 1,000 mg by mouth 2 (two) times daily with a meal. (Patient not taking: Reported on 05/17/2024), Disp: , Rfl:    omega-3 acid ethyl esters (LOVAZA ) 1 g capsule, Take 1 g by mouth 3 (three) times daily. (Patient not taking: Reported on 05/17/2024), Disp: , Rfl:    paliperidone (INVEGA SUSTENNA) 234 MG/1.5ML SUSY injection, Inject 234 mg into the muscle every 30 (thirty) days. (Patient  not taking: Reported on 05/17/2024), Disp: , Rfl:    polyethylene glycol (MIRALAX  / GLYCOLAX ) packet, Take 17 g by mouth daily as needed for mild constipation. (Patient not taking: Reported on 05/17/2024), Disp: , Rfl:    prazosin  (MINIPRESS ) 2 MG capsule, Take 4 mg by mouth at bedtime. (Patient not taking: Reported on 05/17/2024), Disp: , Rfl:    QUEtiapine  (SEROQUEL ) 200 MG tablet, Take 200 mg by mouth  at bedtime. (Patient not taking: Reported on 05/17/2024), Disp: , Rfl:    ranitidine (ZANTAC) 150 MG tablet, Take 150 mg by mouth daily. (Patient not taking: Reported on 05/17/2024), Disp: , Rfl:  [2] Allergies Allergen Reactions   Klonopin [Clonazepam] Other (See Comments)    Seizures   Other Hives and Other (See Comments)    Celery

## 2024-05-17 NOTE — ED Notes (Signed)
 IVC PENDING  CONSULT ?

## 2024-05-17 NOTE — ED Provider Notes (Signed)
 "  [facility] Provider Note    Event Date/Time   First MD Initiated Contact with Patient 05/17/24 1106     (approximate)   History   Abrasion   HPI  Tracie Hanson is a 34 y.o. female bipolar 1 disorder, type 2 diabetes, hypertension, intellectual disability who presented to the emergency department for facial skin lesions and lack of prenatal care.  Patient tells me that she has had skin facial lesions for several months and has been prescribed many different medications and creams.  She has followed up with her primary care physician but has never seen a dermatologist.  She states that she just recently moved back to the area from [facility] and is staying in a townhome with a friend.  She tells me that she is currently [redacted] weeks pregnant and has been out of her diabetes and high blood pressure medication.  Denies any vaginal bleeding or discharge.  She states that her last OB visit was at 8 weeks.  She denies any suicidal or homicidal ideations.  She states that she has been recently in [facility] but psych is not her issue today.      Physical Exam   Triage Vital Signs: ED Triage Vitals [05/17/24 1059]  Encounter Vitals Group     BP (!) 177/100     Girls Systolic BP Percentile      Girls Diastolic BP Percentile      Boys Systolic BP Percentile      Boys Diastolic BP Percentile      Pulse Rate 92     Resp 17     Temp 98.7 F (37.1 C)     Temp Source Oral     SpO2 98 %     Weight 240 lb (108.9 kg)     Height 5' 1 (1.549 m)     Head Circumference      Peak Flow      Pain Score 0     Pain Loc      Pain Education      Exclude from Growth Chart     Most recent vital signs: Vitals:   05/17/24 1059  BP: (!) 177/100  Pulse: 92  Resp: 17  Temp: 98.7 F (37.1 C)  SpO2: 98%    Nursing Triage Note reviewed. Vital signs reviewed and patients oxygen saturation is normoxic  General: Patient is well nourished, well developed, awake and alert,  appears unwell Head: Normocephalic, several scabs along face but no overlying erythema Eyes: Normal inspection, extraocular muscles intact, no conjunctival pallor Ear, nose, throat: Normal external exam Neck: Normal range of motion Respiratory: Patient is in no respiratory distress, lungs CTAB Cardiovascular: Patient is not tachycardic, RRR without murmur appreciated GI: Abd SNT with no guarding or rebound  Back: Normal inspection of the back with good strength and range of motion throughout all ext Extremities: pulses intact with good cap refills, no LE pitting edema or calf tenderness Neuro: The patient is alert and oriented to person, place, and time, appropriately conversive, with 5/5 bilat UE/LE strength, no gross motor or sensory defects noted. Coordination appears to be adequate. Skin: Multiple excoriations on the face and ulcerations of the skin but no erythema Psych: Very anxious mood, odd affect, does not make eye contact, tangential thought, no SI or HI  ED Results / Procedures / Treatments   Labs (all labs ordered are listed, but only abnormal results are displayed) Labs Reviewed  URINALYSIS, ROUTINE W REFLEX  MICROSCOPIC - Abnormal; Notable for the following components:      Result Value   Color, Urine YELLOW (*)    APPearance CLOUDY (*)    Hgb urine dipstick SMALL (*)    Protein, ur >=300 (*)    Bacteria, UA RARE (*)    All other components within normal limits  PROTEIN / CREATININE RATIO, URINE - Abnormal; Notable for the following components:   Protein Creatinine Ratio 2.5 (*)    All other components within normal limits  CHLAMYDIA/NGC RT PCR ([facility] ONLY)            URINE DRUG SCREEN  COMPREHENSIVE METABOLIC PANEL WITH GFR  ETHANOL  CBC  HCG, QUANTITATIVE, PREGNANCY  HEPATITIS B SURFACE ANTIGEN  RUBELLA SCREEN  SYPHILIS: RPR W/REFLEX TO RPR TITER AND TREPONEMAL ANTIBODIES, TRADITIONAL SCREENING AND DIAGNOSIS ALGORITHM  DIFFERENTIAL  HIV ANTIBODY (ROUTINE TESTING  W REFLEX)  HEPATITIS C ANTIBODY, REFLEX  VARICELLA ZOSTER ANTIBODY, IGG  TSH  HEMOGLOBIN A1C  POC URINE PREG, ED  TYPE AND SCREEN     EKG   RADIOLOGY US  OB: Intrauterine pregnancy of 16 weeks and 4 days, there is a fetal heart rate of 155     PROCEDURES:  Critical Care performed: Yes, see critical care procedure note(s)  .Critical Care  Performed by: Nicholaus Rolland BRAVO, MD Authorized by: Nicholaus Rolland BRAVO, MD   Critical care provider statement:    Critical care time (minutes):  35   Critical care was necessary to treat or prevent imminent or life-threatening deterioration of the following conditions: Psychiatric crises with elevated blood pressure and second trimester pregnancy requiring intramuscular medications and multiple consultants.   Critical care was time spent personally by me on the following activities:  Development of treatment plan with patient or surrogate, discussions with consultants, evaluation of patient's response to treatment, examination of patient, ordering and review of laboratory studies, ordering and review of radiographic studies, ordering and performing treatments and interventions, pulse oximetry, re-evaluation of patient's condition, review of old charts and blood draw for specimens    MEDICATIONS ORDERED IN ED: Medications  labetalol  (NORMODYNE ) injection 20 mg (has no administration in time range)    And  labetalol  (NORMODYNE ) injection 40 mg (has no administration in time range)    And  labetalol  (NORMODYNE ) injection 80 mg (has no administration in time range)    And  hydrALAZINE  (APRESOLINE ) injection 10 mg (has no administration in time range)  NIFEdipine  (PROCARDIA ) capsule 10 mg (has no administration in time range)    And  NIFEdipine  (PROCARDIA ) capsule 20 mg (has no administration in time range)    And  NIFEdipine  (PROCARDIA ) capsule 20 mg (has no administration in time range)    And  labetalol  (NORMODYNE ) injection 40 mg (has no  administration in time range)  diphenhydrAMINE  (BENADRYL ) injection 50 mg (50 mg Intramuscular Given 05/17/24 1503)  haloperidol  lactate (HALDOL ) injection 5 mg (5 mg Intramuscular Given 05/17/24 1503)     IMPRESSION / MDM / ASSESSMENT AND PLAN / ED COURSE                                Differential diagnosis includes, but is not limited to, fetal demise, ectopic pregnancy, anemia  ED course: Patient arrives and is initially linear in thought and states that she is only here for her facial skin abrasions.  Unfortunately she is seen to be responding to what appears to be  internal stimuli and I am worried that her blood pressure was extremely elevated and she has not been taking any of her medications.  I immediately put a point-of-care ultrasound on the patient's pelvis and I did see an intrauterine pregnancy but given patient's girth (morbid obese) I was unable to see your heart rate.  I ordered a formal ultrasound and did touch base with OB on-call consultant Dr. Verdon.  She has placed the workup that is necessary to further evaluate this pregnancy and the patient's elevated blood pressure.  Unfortunately the patient has refused or IV attempts and p.o. medications and has become increasingly tangential in thought.  I did not think she had capacity to make decisions and so I have placed her on an IVC.  The psychiatry team came down and evaluated her and agreed.  Consequently we will proceed with intramuscular medications to facilitate workup and assessment.  Psychiatry team does recommend Benadryl  and Haldol  in second trimester pregnancy which has been ordered.  Patient would likely need to be signed out to oncoming physician pending this workup and really touching base with OB once blood work has returned.  Patient signed out to oncoming physician at 3 PM   Clinical Course as of 05/19/24 1541  Tue May 17, 2024  1126 BP(!): 177/100 [HD]  1218 Patient refusing IV and continue to scratch all when I  went to review the indications benefits risks and alternatives with the patient she is unable to repeat back to me her understanding of the events and why we need this and consequently I do not think she has capacity at this time to make this decision.  Unfortunately to further evaluate her, I do believe I need to put her on an IVC and have started such [HD]  1241 Patient now appears to be responding to internal stimuli [HD]  1333 US  OB Limited Fetal HR of 155 on exam [HD]  1401 Psychiatry team at bedside.  [HD]  1416 Psychiatry team states that they agree of the IVC on their assessment and would recommend Benadryl  and Haldol  on pregnancy [HD]  1450 Urine Drug Screen Negative [HD]  1450 Protein / creatinine ratio, urine(!) Protein creatinine ratio only slightly elevated [HD]  1507 S/o from Dr. Nicholaus  55F hx bipolar schizophrenia - [redacted]w[redacted]d pregnant - initially presented with abrasions to face - off meds and prenatal care for past 2 months - no vag bleeding or discharge - BP elevated on arrival - US  reassuring - refusing interventions, is placed on IVC, psychiatry evaluated, concurs -- recommended benadryl , haldol   TO DO: - f/u labs - f/u w/ OB (Dr. Katheryne), meds for gestational htn already written - dispo    [MM]  Wed May 18, 2024  0844 I discuss with pharmacy. No documentation of administered benzos in our system.  [DS]  Thu May 19, 2024  0709 Patient sitting in bed, awake, but in no distress. Placed FHT order Qshift. [HD]  9276 [ ]  Received signout  [HD]  1541 No issues on my shift and patient signed out to oncoming physician at 3 PM [HD]    Clinical Course User Index [DS] Claudene Rover, MD [HD] Nicholaus Rolland BRAVO, MD [MM] Clarine Ozell LABOR, MD   -- Risk: 5 This patient has a high risk of morbidity due to further diagnostic testing or treatment. Rationale: This patients evaluation and management involve a high risk of morbidity due to the potential severity of presenting symptoms,  need for diagnostic testing, and/or initiation  of treatment that may require close monitoring. The differential includes conditions with potential for significant deterioration or requiring escalation of care. Treatment decisions in the ED, including medication administration, procedural interventions, or disposition planning, reflect this level of risk. COPA: 5 The patient has the following acute or chronic illness/injury that poses a possible threat to life or bodily function: [X] : The patient has a potentially serious acute condition or an acute exacerbation of a chronic illness requiring urgent evaluation and management in the Emergency Department. The clinical presentation necessitates immediate consideration of life-threatening or function-threatening diagnoses, even if they are ultimately ruled out.   FINAL CLINICAL IMPRESSION(S) / ED DIAGNOSES   Final diagnoses:  Psychosis, unspecified psychosis type (HCC)  Second trimester pregnancy  Non compliance w medication regimen  Face lesion  Elevated blood pressure reading  No prenatal care in current pregnancy in second trimester     Rx / DC Orders   ED Discharge Orders     None        Note:  This document was prepared using Dragon voice recognition software and may include unintentional dictation errors.   Nicholaus Rolland BRAVO, MD 05/17/24 1525  "

## 2024-05-17 NOTE — ED Provider Notes (Signed)
 Vitals:   05/17/24 2200 05/17/24 2300  BP:  (!) 165/93  Pulse: 97 84  Resp: 14 (!) 23  Temp:    SpO2: 100% 99%     Continue to monitor the patient's blood pressures.  OB team aware and following.  Current plan is for admission to psychiatry.  Close attention for any development of any obstetrical tach symptoms as well as blood pressure management.  Patient does have a preceding history of hypertension.    Dicky Anes, MD 05/17/24 2350

## 2024-05-17 NOTE — ED Provider Notes (Signed)
 Monitor blood pressures overnight.  Steadily improving on medication.  Ongoing care signed Dr. Claudene  Vitals:   05/18/24 0600 05/18/24 0707  BP: (!) 151/99 (!) 145/93  Pulse: 96 95  Resp: 19 16  Temp:  97.7 F (36.5 C)  SpO2: 100% 100%      Tracie Anes, MD 05/18/24 [phone]

## 2024-05-17 NOTE — ED Provider Notes (Signed)
 See ED course below.  Ultrasound showing fetal heart rate of 155 on intrauterine pregnancy.  Blood pressures improved here in emergency department.  Psychiatry recommending psychiatric admission.  Physical Exam  BP (!) 143/85   Pulse 94   Temp 98.5 F (36.9 C) (Oral)   Resp 18   Ht 5' 1 (1.549 m)   Wt 108.9 kg   LMP  (LMP Unknown)   SpO2 98%   BMI 45.35 kg/m   Physical Exam  Procedures  Procedures  ED Course / MDM   Clinical Course as of 05/17/24 2313  Tue May 17, 2024  1126 BP(!): 177/100 [HD]  1218 Patient refusing IV and continue to scratch all when I went to review the indications benefits risks and alternatives with the patient she is unable to repeat back to me her understanding of the events and why we need this and consequently I do not think she has capacity at this time to make this decision.  Unfortunately to further evaluate her, I do believe I need to put her on an IVC and have started such [HD]  1241 Patient now appears to be responding to internal stimuli [HD]  1333 US  OB Limited Fetal HR of 155 on exam [HD]  1401 Psychiatry team at bedside.  [HD]  1416 Psychiatry team states that they agree of the IVC on their assessment and would recommend Benadryl  and Haldol  on pregnancy [HD]  1450 Urine Drug Screen Negative [HD]  1450 Protein / creatinine ratio, urine(!) Protein creatinine ratio only slightly elevated [HD]  1507 S/o from Dr. Nicholaus  38F hx bipolar schizophrenia - [redacted]w[redacted]d pregnant - initially presented with abrasions to face - off meds and prenatal care for past 2 months - no vag bleeding or discharge - BP elevated on arrival - US  reassuring - refusing interventions, is placed on IVC, psychiatry evaluated, concurs -- recommended benadryl , haldol   TO DO: - f/u labs - f/u w/ OB (Dr. Katheryne), meds for gestational htn already written - dispo    [MM]    Clinical Course User Index [HD] Nicholaus Rolland BRAVO, MD [MM] Clarine Ozell LABOR, MD   Medical Decision  Making Amount and/or Complexity of Data Reviewed Labs: ordered. Decision-making details documented in ED Course. Radiology: ordered. Decision-making details documented in ED Course.  Risk Prescription drug management.          Clarine Ozell LABOR, MD 05/18/24 [phone]

## 2024-05-17 NOTE — ED Notes (Signed)
 IVC  CONSULT  DONE  PENDING  PLACEMENT

## 2024-05-17 NOTE — Progress Notes (Signed)
 Per TTS, patient was faxed out to the following facilities:  Service Provider Phone  [facility]  [phone]  [facility]  [phone]  [facility]  [phone]  [facility] [phone]  [facility]  [phone]  [facility]  [phone]  [facility]  [phone]  [facility]  [phone]  [facility]  [phone]  [facility]  [phone]  [facility]  [phone]  [facility]  [phone]  [facility]  [phone]  [facility]  [phone]  [facility]  [phone]  [facility]  [phone]  [facility]  [address], [place] [phone]

## 2024-05-17 NOTE — ED Triage Notes (Addendum)
 Pt sts that she is having a skin condition and that is goes deeper then you can see. Pt sts that she has been in and out of psych hospitals and that is not it. I am having problems with my skin. Pt has what appears to be small round scabs on her face and arms. Pt sts that she is pregnant too. Pt denies any SI/HI

## 2024-05-18 ENCOUNTER — Encounter: Payer: Self-pay | Admitting: Certified Nurse Midwife

## 2024-05-18 LAB — SYPHILIS: RPR W/REFLEX TO RPR TITER AND TREPONEMAL ANTIBODIES, TRADITIONAL SCREENING AND DIAGNOSIS ALGORITHM: RPR Ser Ql: NONREACTIVE

## 2024-05-18 LAB — CBG MONITORING, ED: Glucose-Capillary: 148 mg/dL — ABNORMAL HIGH (ref 70–99)

## 2024-05-18 MED ORDER — INSULIN ASPART 100 UNIT/ML IJ SOLN
0.0000 [IU] | Freq: Three times a day (TID) | INTRAMUSCULAR | Status: DC
  Administered 2024-05-18: 3 [IU] via SUBCUTANEOUS
  Filled 2024-05-18: qty 2
  Filled 2024-05-18: qty 3

## 2024-05-18 MED ORDER — QUETIAPINE FUMARATE 25 MG PO TABS
50.0000 mg | ORAL_TABLET | Freq: Every day | ORAL | Status: DC
  Administered 2024-05-18 – 2024-05-19 (×2): 25 mg via ORAL
  Filled 2024-05-18 (×2): qty 2

## 2024-05-18 MED ORDER — HALOPERIDOL LACTATE 5 MG/ML IJ SOLN
5.0000 mg | Freq: Once | INTRAMUSCULAR | Status: AC
  Administered 2024-05-18: 5 mg via INTRAMUSCULAR
  Filled 2024-05-18: qty 1

## 2024-05-18 MED ORDER — NIFEDIPINE ER OSMOTIC RELEASE 30 MG PO TB24
30.0000 mg | ORAL_TABLET | Freq: Every day | ORAL | Status: DC
  Administered 2024-05-19: 30 mg via ORAL
  Filled 2024-05-18 (×3): qty 1

## 2024-05-18 MED ORDER — HALOPERIDOL 5 MG PO TABS
5.0000 mg | ORAL_TABLET | Freq: Four times a day (QID) | ORAL | Status: DC | PRN
  Filled 2024-05-18: qty 1

## 2024-05-18 MED ORDER — HALOPERIDOL LACTATE 5 MG/ML IJ SOLN: 5.0000 mg | Freq: Four times a day (QID) | INTRAMUSCULAR | Status: DC | PRN

## 2024-05-18 MED ORDER — DIPHENHYDRAMINE HCL 50 MG/ML IJ SOLN
50.0000 mg | Freq: Once | INTRAMUSCULAR | Status: AC
  Administered 2024-05-18: 50 mg via INTRAMUSCULAR
  Filled 2024-05-18: qty 1

## 2024-05-18 NOTE — ED Notes (Signed)
 Pt rounded on at this time when pt became acutely agitated, flailing and screaming at this RN to take off blood pressure cuff shouting this isn't fair. Attempted to reorient and reassure pt without evidence of learning noted. Pt continues to escalate screaming and thrashing in bed. Provider notified and pt medicated per MAR. Pt now resting with RR even and unlabored without acute needs.

## 2024-05-18 NOTE — ED Notes (Addendum)
 Patient called staff in the room stating that she has an important meeting tomorrow, then noted a few minutes later to be crying loudly. Patient told this nurse that she is just upset because no one believes that she is not schizoaffective but is BPD. States that she agreed to see a psychologist as an outpatient and that it is not fair that she is in here involuntarily when she should be at home and seeing someone outside of here. Provided therapeutic active listening skills and was able to get patient calmed down and resting in a position of comfort.

## 2024-05-18 NOTE — Consult Note (Addendum)
 ANTEPARTUM CONSULT NOTE  History of Present Illness: Tracie Hanson is a pleasant 34 y.o. G1P0 at [redacted]w[redacted]d by [redacted]w[redacted]d US  admitted for psychosis and schizophrenia.  OB/GYN was consulted since she is [redacted]w[redacted]d with limited prenatal care. She is currently un-housed and recently came to [facility]. She was seen in the ER for lesions on her face and was IVC'd. She is waiting for a bed on psychiatry.  She is not yet feeling fetal movement. She denies contractions, bleeding, or leaking fluid. She denies any pregnancy complaints.   She states she started prenatal care in [place], [place] but was told she had to be transferred to [facility] for high-risk care but does not have a car so was unable to go and establish care. She had an US  at [facility] on 05/06/24 and was IVC'd at [facility] at that time.   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   Alcohol abuse 04/02/2018   Acute cholecystitis 03/19/2018   Diabetes mellitus without complication (HCC) 01/08/2018   Intellectual disability 01/08/2018   Hypertension 01/08/2018   Malingering 01/08/2018    Past Medical History:  Diagnosis Date   Anemia    Antisocial personality disorder (HCC)    Bipolar 1 disorder (HCC)    Diabetes mellitus without complication (HCC)    GERD (gastroesophageal reflux disease)    Hyperprolactinemia    Hypertension    Intellectual disability    Narcissistic personality disorder (HCC)    Normocytic anemia    Personality disorder (HCC)    Schizoaffective disorder (HCC)     Past Surgical History:  Procedure Laterality Date   COLPOSCOPY      OB History  Gravida Para Term Preterm AB Living  1       SAB IAB Ectopic Multiple Live Births          # Outcome Date GA Lbr Len/2nd Weight Sex Type Anes PTL Lv  1 Current             Social History   Socioeconomic History   Marital status: Single    Spouse name: Not on file    Number of children: Not on file   Years of education: Not on file   Highest education level: Not on file  Occupational History   Not on file  Tobacco Use   Smoking status: Never   Smokeless tobacco: Never  Substance and Sexual Activity   Alcohol use: Never   Drug use: Never   Sexual activity: Not on file  Other Topics Concern   Not on file  Social History Narrative   Not on file   Social Drivers of Health   Tobacco Use: Low Risk (05/17/2024)   Patient History    Smoking Tobacco Use: Never    Smokeless Tobacco Use: Never    Passive Exposure: Not on file  Financial Resource Strain: Not on file  Food Insecurity: Not on file  Transportation Needs: Not on file  Physical Activity: Not on file  Stress: Not on file  Social Connections: Not on file  Depression (EYV7-0): Not on file  Alcohol Screen: Not on file  Housing: Not on file  Utilities: Not on file  Health Literacy: Not on file    History reviewed. No pertinent family history.  Allergies[1]  (Not in a hospital admission)   Review of Systems - Denies any concerns  Vitals:  BP 120/77   Pulse 96  Temp 97.7 F (36.5 C)   Resp 16   Ht 5' 1 (1.549 m)   Wt 108.9 kg   LMP  (LMP Unknown)   SpO2 99%   BMI 45.35 kg/m  Physical Examination: CONSTITUTIONAL: Well-developed, well-nourished female in no acute distress.  HENT:  Normocephalic, atraumatic,  EYES: Conjunctivae and EOM are normal. Pupils are equal, round, and reactive to light. No scleral icterus.  SKIN: Skin is warm and dry. many circular lesions across her face. Not diaphoretic. No erythema. No pallor. PSYCHIATRIC: Normal mood and affect. Normal behavior.  CARDIOVASCULAR: Normal heart rate noted, regular rhythm RESPIRATORY: Effort and breath sounds normal, no problems with respiration noted ABDOMEN: Soft, nontender, nondistended, gravid. MUSCULOSKELETAL: Normal range of motion.   Fetal Doppler: FHT 155bpm, distinguished from maternal pulse  Labs:   Results for orders placed or performed during the hospital encounter of 05/17/24 (from the past 24 hours)  Hepatitis B surface antigen   Collection Time: 05/17/24  4:03 PM  Result Value Ref Range   Hepatitis B Surface Ag NON REACTIVE NON REACTIVE  RPR   Collection Time: 05/17/24  4:03 PM  Result Value Ref Range   RPR Ser Ql NON REACTIVE NON REACTIVE  TSH   Collection Time: 05/17/24  4:03 PM  Result Value Ref Range   TSH 2.800 0.350 - 4.500 uIU/mL  Hemoglobin A1c   Collection Time: 05/17/24  4:03 PM  Result Value Ref Range   Hgb A1c MFr Bld 5.9 (H) 4.8 - 5.6 %   Mean Plasma Glucose 122.63 mg/dL  Type and screen   Collection Time: 05/17/24  4:03 PM  Result Value Ref Range   ABO/RH(D) O POS    Antibody Screen NEG    Sample Expiration      05/20/2024,2359 Performed at [facility], [address] [place], [place] [address]   Chlamydia/NGC rt PCR [facility] only)   Collection Time: 05/17/24  9:57 PM   Specimen: Cervical/Vaginal swab  Result Value Ref Range   Specimen source GC/Chlam ENDOCERVICAL    Chlamydia Tr NOT DETECTED NOT DETECTED   N gonorrhoeae NOT DETECTED NOT DETECTED    Imaging Studies: US  OB Limited Result Date: 05/17/2024 CLINICAL DATA:  Lack of fetal heart tones. EXAM: LIMITED OBSTETRIC ULTRASOUND COMPARISON:  None Available. FINDINGS: Number of Fetuses: 1 Heart Rate:  155 bpm Movement: Present Presentation: Breech Placental Location: Anterior Previa: No Amniotic Fluid (Subjective):  Within normal limits. BPD: 3.4 cm 16 w  4 d MATERNAL FINDINGS: Cervix:  Appears closed.-measures 3.1 cm. Uterus/Adnexae: Right ovary not visualized. Left ovary within normal limits. IMPRESSION: Intrauterine pregnancy of approximately 16 weeks 4 days. Fetal heart rate of 155 beats per minute. Breech presentation. This exam is performed on an emergent basis and does not comprehensively evaluate fetal size, dating, or anatomy; follow-up complete OB US  should be considered if further  fetal assessment is warranted. Electronically Signed   By: Rockey Kilts M.D.   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   Alcohol abuse 04/02/2018   Acute cholecystitis 03/19/2018   Diabetes mellitus without complication (HCC) 01/08/2018   Intellectual disability 01/08/2018   Hypertension 01/08/2018   Malingering 01/08/2018   # High Risk Pregnancy in Second Trimester: - OB panel labs ordered - Anatomy US  @ 20wks if still inpatient at that time - She states she has an  upcoming OB appointment with a local OB/GYN that her case worker has scheduled for her, but she is unsure what OB practice it is with  # Schizoaffective disorder/psychosis:  - follow psychiatry recommendations - pregnancy should not keep her from receiving appropriate psychiatric care  # T2DM in pregnancy: - she reports she typically takes Lantus 20u daily but has been unable to afford her medications so is not taking it. - HgbA1C 5.9 - Start CBG fasting and 2hr postprandial - sliding scale coverage ordered - diabetes coordinator consulted  # cHTN in pregnancy: - She reports she typically takes Labetalol  400mg  daily but states it does not help her BP and would prefer not to take Labetalol  anymore.  - BP typically 150s/90s since being admitted, highest BP 177/100 02/14/25 @ 1059  - Severe HTN medications ordered - Start Procardia  30mg  XL daily  # Obesity: - BMI 45  Thank you for consulting us  to be involved in her care.  Edsel Charlies Blush, CNM 05/18/2024 2:52 PM      [1]  Allergies Allergen Reactions   Klonopin [Clonazepam] Other (See Comments)    Seizures   Other Hives and Other (See Comments)    [place]

## 2024-05-18 NOTE — ED Notes (Signed)
 Pt given snack.

## 2024-05-18 NOTE — ED Notes (Signed)
 IVC/  PENDING  PLACEMENT

## 2024-05-18 NOTE — ED Notes (Signed)
 Pt screaming and crying loudly in room. This RN to bedside to attempt to verbally deescolate pt. Pt asked to lower voice as she is disturbing other pts, pt continues to escalate yelling louder at this RN. Pt reminded behavior is not appropriate. No evidence of learning noted. Provider notified of behavior.

## 2024-05-18 NOTE — ED Notes (Signed)
 Meal provided

## 2024-05-18 NOTE — ED Notes (Signed)
 Patient sleeping

## 2024-05-18 NOTE — Inpatient Diabetes Management (Signed)
 ADA Standards of Care 2023 Diabetes in Pregnancy Target Glucose Ranges:  Fasting: 70 - 95 mg/dL 1 hr postprandial:  889 - 140mg /dL (from first bite of meal) 2 hr postprandial:  100 - 120 mg/dL (from first bit of meal)    Latest Reference Range & Units 05/17/24 16:03  Hemoglobin A1C 4.8 - 5.6 % 5.9 (H)  (H): Data is abnormally high  Latest Reference Range & Units 05/17/24 12:34  Glucose 70 - 99 mg/dL 90     Admit with: Facial skin lesions and lack of prenatal care  Has been out of DM and HTN meds IVC'd in the ED [redacted]w[redacted]d pregnant   History: DM2, Intellectual Disability, Bipolar Schizophrenia   Home DM Meds: Metformin 1000 mg BID (NOT taking)  Current Orders: Novolog  0-16 units TID after meals   Note orders placed by CNM for CBG checks AM Fasting and 2 hr postprandial and Novolog  0-16 units TID 2hr postprandial Will follow up in AM   --Will follow patient during hospitalization--  Adina Rudolpho Arrow RN, MSN, CDCES Diabetes Coordinator Inpatient Glycemic Control Team Team Pager: [phone] (8a-5p)

## 2024-05-18 NOTE — ED Provider Notes (Signed)
----------------------------------------- °  4:05 PM on 05/18/2024 ----------------------------------------- Patient has become acutely agitated, unable to redirect verbally.  Seems that she became agitated mostly due to the blood pressure cuff but is now hyperventilating screaming mildly tearful.  Will dose 5 mg of Haldol  and 50 mg of Benadryl  IM for patient's sedation and agitation.  She is not being violent at this time.  But is actively hyperventilating and yelling.   Dorothyann Drivers, MD 05/18/24 2340

## 2024-05-18 NOTE — ED Notes (Signed)
 Meal provided patient sleeping left  meal at bedside

## 2024-05-18 NOTE — ED Notes (Signed)
 Patient up to bathroom

## 2024-05-18 NOTE — ED Provider Notes (Signed)
 Emergency Medicine Observation Re-evaluation Note  Braxton Weisbecker is a 34 y.o. female, seen on rounds today.  Pt initially presented to the ED for complaints of Abrasion Currently, the patient is resting.  Physical Exam  BP (!) 145/93   Pulse 95   Temp 97.7 F (36.5 C)   Resp 16   Ht 5' 1 (1.549 m)   Wt 108.9 kg   LMP  (LMP Unknown)   SpO2 100%   BMI 45.35 kg/m   General: No distress   ED Course / MDM  EKG:   I have reviewed the labs performed to date as well as medications administered while in observation.  Recent changes in the last 24 hours include seen by psych recommending inpatient. Elevated BP c/w gestational HTN, antihypertensives ordered.  Plan  Current plan is for placement.    Claudene Rover, MD 05/18/24 [phone]

## 2024-05-18 NOTE — Consult Note (Signed)
 [facility] Psychiatric Consult Follow Up  Patient Name: .Tracie Hanson  MRN: [id number]  DOB: 09-19-1990  Consult Order details:  Orders (From admission, onward)     Start     Ordered   05/17/24 1220  CONSULT TO CALL ACT TEAM       Ordering Provider: Nicholaus Rolland BRAVO, MD  Provider:  (Not yet assigned)  Question:  Reason for Consult?  Answer:  Psych consult   05/17/24 1220   05/17/24 1220  IP CONSULT TO PSYCHIATRY       Ordering Provider: Nicholaus Rolland BRAVO, MD  Provider:  (Not yet assigned)  Question:  Reason for consult:  Answer:  Medication management   05/17/24 1220             Mode of Visit: In person    Psychiatry Consult Evaluation  Service Date: May 18, 2024 LOS:  LOS: 0 days  Chief Complaint My skin is being stupid  Primary Psychiatric Diagnoses  Schizoaffective disorder [facility])   Assessment  CLINICAL DECISION MAKING:  Patient with psychiatric history (schizoaffective disorder noted in chart) presenting with concerns of skin condition, likely visual hallucinations since September, paranoia, impaired reasoning, and appearing to respond to internal stimuli. Currently [redacted] weeks pregnant without prenatal care. Non-adherent with all medications including psychotropics, diabetes, and blood pressure medications. Risk to self due to medication non-adherence, impaired judgment, and paranoid presentation with psychotic symptoms.  PLAN:  IVC upheld due to risk to self from medication non-adherence (psychotropic, diabetes, and blood pressure medications), impaired reasoning and judgment, paranoid presentation with psychotic symptoms, and lack of prenatal care despite [redacted] weeks gestation   After medical clearance, recommend inpatient psychiatric admission for medication stabilization, establishment of prenatal care, management of chronic medical conditions, and safety monitoring  05/18/2024: Patient seen on rounds today while holding in emergency department under involuntary  commitment with ongoing OB/GYN monitoring; patient more cooperative than prior assessments, denied suicidal/homicidal ideations, endorsed hallucinations but difficulty explaining, and required as-needed agitation medications earlier in the day due to behavioral concerns. After thorough discussion of risks, benefits, and side effects, patient gave informed consent to reinitiate Seroquel  50 mg nightly (preferred once-daily dosing to promote adherence, reported past oversedation at 200 mg); patient recommended for inpatient psychiatric admission following medical stabilization, and psychiatry will continue to round while coordinating with primary medical team and OB/GYN services while patient is holding in the emergency room.  Diagnoses:  Active Hospital problems: Active Problems:   Schizoaffective disorder (HCC)    Plan   ## Disposition: Patient currently undergoing medical evaluation/workup, after medical clearance will be recommended for inpatient psychiatric admission.  Due to patient current presentation, psychiatry does recommend to uphold IVC at this time.  ## Psychiatric Medication Recommendations: Haldol  and Benadryl  PRN for agitation/psychosis only, limited use given 17-week pregnancy; use restricted to situations where benefits outweigh risks to maintain patient and staff safety  -Seroquel  50 mg nightly initiated   *In a pregnant patient with acute psychosis, paranoia, impaired reasoning, refusing medical care (including prenatal care), and medication non-adherence with chronic medical conditions (diabetes, hypertension), the immediate risk of uncontrolled psychiatric illness outweighs the relatively well-studied risks of limited PRN Haldol  and Benadryl  use. This combination provides the longest safety track record in pregnancy for managing acute agitation and psychosis, while the PRN-only approach minimizes fetal exposure.*     ## Medical Decision Making Capacity: Not specifically  addressed in this encounter  ## Further Work-up:  EKG- QTC: 442 on 05/17/2024 Labs: CBC, CMP, urine  drug screen, ethanol  ## Behavioral / Environmental: -Utilize compassion and acknowledge the patient's experiences while setting clear and realistic expectations for care.    ## Safety and Observation Level:  - Based on my clinical evaluation, I estimate the patient to be at low risk of self harm in the current setting. - At this time, we recommend  routine. This decision is based on my review of the chart including patient's history and current presentation, interview of the patient, mental status examination, and consideration of suicide risk including evaluating suicidal ideation, plan, intent, suicidal or self-harm behaviors, risk factors, and protective factors. This judgment is based on our ability to directly address suicide risk, implement suicide prevention strategies, and develop a safety plan while the patient is in the clinical setting. Please contact our team if there is a concern that risk level has changed.  CSSR Risk Category:C-SSRS RISK CATEGORY:  Flowsheet Row ED from 05/17/2024 in [facility] Department at [facility] ED to Hosp-Admission (Discharged) from 03/19/2018 in [facility] from 01/07/2018 in [facility] Emergency Department at [facility]  C-SSRS RISK CATEGORY No Risk No Risk Moderate Risk     Suicide Risk Assessment: Patient has following modifiable risk factors for suicide: medication noncompliance, lack of access to outpatient mental health resources, active mental illness (to encompass adhd, tbi, mania, psychosis, trauma reaction), and triggering events, which we are addressing by recommending inpatient psychiatric admission following medical stabilization/clearance.  Patient has following non-modifiable or demographic risk factors for suicide: psychiatric hospitalization  Patient has the following  protective factors against suicide: Supportive friends and pregnancy  Thank you for this consult request. Recommendations have been communicated to the primary team.  We will sign off at this time.       History of Present Illness  Relevant Aspects of Hospital ED   Patient Report:   SUBJECTIVE: Patient presented to emergency department with concerns of having a skin condition that is deeper than you can see. Reports skin is stupid, itching and burning and all kinds of things but would not elaborate further, stating she would end up stuck here. Reports ongoing since September with no relief from treatments. Denies suicidal or homicidal ideations. When asked about auditory or visual hallucinations, patient loudly stated probably regarding visual hallucinations but refused to elaborate, reports ongoing since September as well. Reports doing fine. Currently staying in [place]. Confirmed that she is currently her own legal guardian. Gave permission for psychiatry to contact friend Latoya.  Reports not taking previously prescribed psychotropic medications. Not taking diabetes or blood pressure medications. Patient is [redacted] weeks pregnant and has received no outpatient OB/GYN care.  Collateral from [facility] (patient gave permission; collateral requested information not be shared with patient due to risk to therapeutic relationship): Patient reached out to [facility] on 1/17 stating she had no place to go in freezing temperatures. Approved to stay at one of Latoya's support living houses. Five years ago, patient resided there but was removed due to behaviors including throwing plates and chasing residents; Latoya notes vast improvement in these behaviors. Patient disclosed to Latoya that she stopped all medications, attempted to get pregnant, and will not take medications due to fear of harm during pregnancy. Latoya reports noticing patient looking past people or appearing to respond to internal stimuli  and feels patient needs medications. Patient previously had court-ordered legal guardianship but regained own guardianship in 2022; Latoya voiced concerns about this decision. Reported that patient requires gentle approach  as she becomes defensive and upset easily. Latoya expressed concerns regarding patient's current rationale and reasoning.  OBJECTIVE: Patient irritable, defensive, and tearful on exam. When psychiatry team introduced self, patient loudly stated See this is why I don't come anywhere always end up in the psych ward and became tearful. Appears very paranoid and anxious. Displaying impaired rationalization and reasoning. Example of impaired reasoning: I understand, but I can't when asked to provide blood sample for further medical evaluation, despite extensive explanation of need. Nursing staff reported patient looking at things that were not there and patient would not elaborate when asked. Exam limited due to patient's current status and refusal to engage fully. Patient refused blood sample, became irritable and tearful with impaired reasoning when persuaded.  Collateral information from Latoya communicated to primary medical team; per collateral source request, information not to be shared with patient to preserve therapeutic relationship and trust  05/18/2024:  Patient was seen on rounds today by psychiatry. Patient continues to be monitored by OB/GYN services while holding in the emergency department; please reference their notes for detailed medical reports. Patient is currently under involuntary commitment due to concerns regarding medication noncompliance and decompensation in mental health status as documented in previous psychiatric consultation notes.  On today's examination, patient was notably more agreeable to discussing her current clinical status with this provider compared to prior assessments. Patient voiced frustration with her current circumstances but demonstrated  improved cooperation and understanding of the need for psychiatric interventions during today's discussion. Of note, patient did require as-needed agitation medications earlier in the day due to behavioral concerns, please reference nursing documentation for detailed behavioral reports. Patient currently denies suicidal ideations and homicidal ideations. When questioned about auditory or visual hallucinations, patient endorsed their presence but reported difficulty explaining what she is experiencing. Patient expressed hesitation to discuss specific events, stating she feels as though she always ends up in psych wards and does not believe this is beneficial to her recovery. Patient did report increased anxiety related to multiple increased life stressors recently.  This provider engaged patient in thorough discussion regarding risks, benefits, and side effects of reinitiating Seroquel  for symptom management. Patient verbalized understanding of the information provided and gave informed consent at this time for low-dose Seroquel  to be initiated nightly. Patient expressed preference for once-daily dosing to promote medication adherence, and this dosing schedule was incorporated into treatment planning. Patient reported that Seroquel  had worked well for her in the past, however, she noted that previous dosing at 200 mg, which she considered high, resulted in feeling like a zombie throughout the day with excessive sedation. After thorough discussion and shared decision-making, patient was agreeable to starting Seroquel  50 mg at bedtime, which was ordered accordingly.  Psychiatry team will continue to round on patient while she is holding in the emergency department. Patient is recommended for inpatient psychiatric admission following medical stabilization and clearance from OB/GYN services. Psychiatry will continue to work collaboratively with the primary medical team and OB/GYN team while patient is holding in  the emergency department awaiting appropriate psychiatric placement.   Psychiatric and Social History  Psychiatric History:  Information collected from patient/chart  Prev Dx/Sx: Schizoaffective disorder documented in chart Current Psych Provider: None currently Home Meds (current): Patient denied taking any current medications Previous Med Trials: Invega and Seroquel  listed in chart Therapy: Denied Prior Psych Hospitalization: Yes Prior Suicide attempt/Self Harm: Documented history of suicidal ideations Prior Violence: Documented history of aggressive behavior  Family Psych History: Unknown Family Hx  suicide: Unknown  Social History:   Occupational Hx: Employed Legal Hx: Denied Living Situation: Patient just recently transition to living in supportive living house on January 17  Access to weapons/lethal means: Patient denied  Substance History Alcohol: Denied Tobacco: Denied Illicit drugs: Denied Prescription drug abuse: Denied Rehab hx: Denied  Exam Findings  Physical Exam: Reviewed and agree with the physical exam findings conducted by the medical provider Vital Signs:  Temp:  [97.7 F (36.5 C)-98.5 F (36.9 C)] 97.7 F (36.5 C) (01/28 1011) Pulse Rate:  [80-97] 88 (01/28 1133) Resp:  [8-24] 16 (01/28 1046) BP: (118-174)/(74-151) 118/74 (01/28 1133) SpO2:  [96 %-100 %] 99 % (01/28 1046) Blood pressure 118/74, pulse 88, temperature 97.7 F (36.5 C), resp. rate 16, height 5' 1 (1.549 m), weight 108.9 kg, SpO2 99%. Body mass index is 45.35 kg/m.    Mental Status Exam: General Appearance: Disheveled  Orientation:  Full (Time, Place, and Person)  Memory:  Fair  Concentration:  Concentration: Poor  Recall:  Fair  Attention  Poor  Eye Contact:  Fair  Speech:  Clear and Coherent  Language:  Fair  Volume:  Increased  Mood: Irritable  Affect:  Labile  Thought Process:  UTA  Thought Content:  Hallucinations: Visual  Suicidal Thoughts:  No  Homicidal  Thoughts:  No  Judgement:  Poor  Insight:  Lacking  Psychomotor Activity:  Normal  Akathisia:  No  Fund of Knowledge:  UTA      Assets:  Housing Social Support  Cognition:  Impaired  ADL's:  Intact  AIMS (if indicated):        Other History   These have been pulled in through the EMR, reviewed, and updated if appropriate.  Family History:  The patient's family history is not on file.  Medical History: Past Medical History:  Diagnosis Date   Anemia    Antisocial personality disorder (HCC)    Bipolar 1 disorder (HCC)    Diabetes mellitus without complication (HCC)    GERD (gastroesophageal reflux disease)    Hyperprolactinemia    Hypertension    Intellectual disability    Narcissistic personality disorder (HCC)    Normocytic anemia    Personality disorder (HCC)    Schizoaffective disorder (HCC)     Surgical History: Past Surgical History:  Procedure Laterality Date   COLPOSCOPY       Medications:  Current Medications[1]  Allergies: Allergies[2]  A. Claudene, NP This note was created using Scientist, clinical (histocompatibility and immunogenetics). Please excuse any inadvertent transcription errors. Case was discussed with supervising physician Dr. Jadapalle who is agreeable with current plan.        [1]  Current Facility-Administered Medications:    labetalol  (NORMODYNE ) injection 20 mg, 20 mg, Intravenous, PRN **AND** labetalol  (NORMODYNE ) injection 40 mg, 40 mg, Intravenous, PRN **AND** labetalol  (NORMODYNE ) injection 80 mg, 80 mg, Intravenous, PRN **AND** hydrALAZINE  (APRESOLINE ) injection 10 mg, 10 mg, Intravenous, PRN **AND** [COMPLETED] Measure blood pressure, , , Once, Verdon Keen, MD   NIFEdipine  (PROCARDIA ) capsule 10 mg, 10 mg, Oral, PRN **AND** NIFEdipine  (PROCARDIA ) capsule 20 mg, 20 mg, Oral, PRN **AND** NIFEdipine  (PROCARDIA ) capsule 20 mg, 20 mg, Oral, PRN **AND** labetalol  (NORMODYNE ) injection 40 mg, 40 mg, Intravenous, PRN **AND** [COMPLETED] Measure blood  pressure, , , Once, Myron Nest, CNM  Current Outpatient Medications:    albuterol  (PROVENTIL  HFA;VENTOLIN  HFA) 108 (90 Base) MCG/ACT inhaler, Inhale 2 puffs into the lungs every 6 (six) hours as needed for wheezing or shortness of breath., Disp: ,  Rfl:    acetaminophen  (TYLENOL ) 650 MG CR tablet, Take 650 mg by mouth every 8 (eight) hours as needed for pain. (Patient not taking: Reported on 05/17/2024), Disp: , Rfl:    alum & mag hydroxide-simeth (MAALOX/MYLANTA) 200-200-20 MG/5ML suspension, Take 30 mLs by mouth every 6 (six) hours as needed for indigestion or heartburn. (Patient not taking: Reported on 05/17/2024), Disp: , Rfl:    budesonide-formoterol  (SYMBICORT) 160-4.5 MCG/ACT inhaler, Inhale 2 puffs into the lungs 2 (two) times daily. (Patient not taking: Reported on 05/17/2024), Disp: , Rfl:    divalproex  (DEPAKOTE  SPRINKLE) 125 MG capsule, Take 16 capsules (2,000 mg total) by mouth at bedtime. (Patient not taking: Reported on 05/17/2024), Disp: 480 capsule, Rfl: 0   divalproex  (DEPAKOTE ) 500 MG DR tablet, Take 1,500 mg by mouth 2 (two) times daily. (Patient not taking: Reported on 05/17/2024), Disp: , Rfl:    hydrochlorothiazide  (HYDRODIURIL ) 25 MG tablet, Take 12.5 mg by mouth daily. (Patient not taking: Reported on 05/17/2024), Disp: , Rfl:    HYDROcodone -acetaminophen  (NORCO/VICODIN) 5-325 MG tablet, Take 1 tablet by mouth every 6 (six) hours as needed for severe pain. (Patient not taking: Reported on 05/17/2024), Disp: 10 tablet, Rfl: 0   hydrOXYzine  (ATARAX /VISTARIL ) 25 MG tablet, Take 25 mg by mouth every 6 (six) hours as needed. (Patient not taking: Reported on 05/17/2024), Disp: , Rfl:    hydrOXYzine  (VISTARIL ) 25 MG capsule, Take 25 mg by mouth 3 (three) times daily as needed. (Patient not taking: Reported on 05/17/2024), Disp: , Rfl:    ibuprofen (ADVIL,MOTRIN) 400 MG tablet, Take 400 mg by mouth every 6 (six) hours as needed for moderate pain. (Patient not taking: Reported on  05/17/2024), Disp: , Rfl:    Levonorgest-Eth Estrad-Fe Bisg (BALCOLTRA) 0.1-20 MG-MCG(21) TABS, Take 1 tablet by mouth daily. (Patient not taking: Reported on 05/17/2024), Disp: , Rfl:    lisinopril  (PRINIVIL ,ZESTRIL ) 40 MG tablet, Take 40 mg by mouth daily. (Patient not taking: Reported on 05/17/2024), Disp: , Rfl:    Melatonin 3 MG TABS, Take 6 mg by mouth at bedtime. (Patient not taking: Reported on 05/17/2024), Disp: , Rfl:    metFORMIN (GLUCOPHAGE) 1000 MG tablet, Take 1,000 mg by mouth 2 (two) times daily with a meal. (Patient not taking: Reported on 05/17/2024), Disp: , Rfl:    omega-3 acid ethyl esters (LOVAZA ) 1 g capsule, Take 1 g by mouth 3 (three) times daily. (Patient not taking: Reported on 05/17/2024), Disp: , Rfl:    paliperidone (INVEGA SUSTENNA) 234 MG/1.5ML SUSY injection, Inject 234 mg into the muscle every 30 (thirty) days. (Patient not taking: Reported on 05/17/2024), Disp: , Rfl:    polyethylene glycol (MIRALAX  / GLYCOLAX ) packet, Take 17 g by mouth daily as needed for mild constipation. (Patient not taking: Reported on 05/17/2024), Disp: , Rfl:    prazosin  (MINIPRESS ) 2 MG capsule, Take 4 mg by mouth at bedtime. (Patient not taking: Reported on 05/17/2024), Disp: , Rfl:    QUEtiapine  (SEROQUEL ) 200 MG tablet, Take 200 mg by mouth at bedtime. (Patient not taking: Reported on 05/17/2024), Disp: , Rfl:    ranitidine (ZANTAC) 150 MG tablet, Take 150 mg by mouth daily. (Patient not taking: Reported on 05/17/2024), Disp: , Rfl:  [2] Allergies Allergen Reactions   Klonopin [Clonazepam] Other (See Comments)    Seizures   Other Hives and Other (See Comments)    Celery

## 2024-05-18 NOTE — ED Notes (Signed)
 Pt given dinner tray and beverage

## 2024-05-18 NOTE — ED Notes (Signed)
 Patient in floor rocking back and fourth at this time.

## 2024-05-18 NOTE — Progress Notes (Addendum)
 Per TTS, patient was referred to the following facilities:   Service Provider Phone  [facility]  [phone]  [facility]  [phone]  [facility]  [phone]  [facility]  [phone]  [facility]  [phone]  [facility]  [phone]  [facility]  [phone]  [facility]  [phone]  [facility]  [phone]  [facility]  [phone]  [facility]  [phone]  [place] [facility]  [phone]  [facility]  [phone]  [facility]  [phone]  [facility]  [phone]  [facility]  [phone]  [facility]  [address], [place] [phone]

## 2024-05-19 LAB — MISC LABCORP TEST (SEND OUT): Labcorp test code: 83935

## 2024-05-19 LAB — CBG MONITORING, ED
Glucose-Capillary: 69 mg/dL — ABNORMAL LOW (ref 70–99)
Glucose-Capillary: 86 mg/dL (ref 70–99)
Glucose-Capillary: 94 mg/dL (ref 70–99)

## 2024-05-19 LAB — VARICELLA ZOSTER ANTIBODY, IGG: Varicella IgG: REACTIVE

## 2024-05-19 LAB — RUBELLA SCREEN: Rubella: 6.76 {index}

## 2024-05-19 MED ORDER — COMPLETENATE 29-1 MG PO CHEW
1.0000 | CHEWABLE_TABLET | Freq: Every day | ORAL | Status: DC
  Administered 2024-05-19 – 2024-05-20 (×2): 1 via ORAL
  Filled 2024-05-19 (×3): qty 1

## 2024-05-19 NOTE — ED Notes (Signed)
 IVC/  PENDING  PLACEMENT

## 2024-05-19 NOTE — Consult Note (Signed)
 [facility] Psychiatric Consult Follow Up  Patient Name: .Tracie Hanson  MRN: [id number]  DOB: [birth year]  Consult Order details:  Orders (From admission, onward)     Start     Ordered   05/17/24 1220  CONSULT TO CALL ACT TEAM       Ordering Provider: Nicholaus Rolland BRAVO, MD  Provider:  (Not yet assigned)  Question:  Reason for Consult?  Answer:  Psych consult   05/17/24 1220   05/17/24 1220  IP CONSULT TO PSYCHIATRY       Ordering Provider: Nicholaus Rolland BRAVO, MD  Provider:  (Not yet assigned)  Question:  Reason for consult:  Answer:  Medication management   05/17/24 1220             Mode of Visit: In person    Psychiatry Consult Evaluation  Service Date: May 19, 2024 LOS:  LOS: 0 days  Chief Complaint My skin is being stupid  Primary Psychiatric Diagnoses  Schizoaffective disorder [facility])   Assessment  CLINICAL DECISION MAKING:  Patient with psychiatric history (schizoaffective disorder noted in chart) presenting with concerns of skin condition, likely visual hallucinations since September, paranoia, impaired reasoning, and appearing to respond to internal stimuli. Currently [redacted] weeks pregnant without prenatal care. Non-adherent with all medications including psychotropics, diabetes, and blood pressure medications. Risk to self due to medication non-adherence, impaired judgment, and paranoid presentation with psychotic symptoms.  PLAN:  IVC upheld due to risk to self from medication non-adherence (psychotropic, diabetes, and blood pressure medications), impaired reasoning and judgment, paranoid presentation with psychotic symptoms, and lack of prenatal care despite [redacted] weeks gestation   After medical clearance, recommend inpatient psychiatric admission for medication stabilization, establishment of prenatal care, management of chronic medical conditions, and safety monitoring  05/18/2024: Patient seen on rounds today while holding in emergency department under involuntary  commitment with ongoing OB/GYN monitoring; patient more cooperative than prior assessments, denied suicidal/homicidal ideations, endorsed hallucinations but difficulty explaining, and required as-needed agitation medications earlier in the day due to behavioral concerns. After thorough discussion of risks, benefits, and side effects, patient gave informed consent to reinitiate Seroquel  50 mg nightly (preferred once-daily dosing to promote adherence, reported past oversedation at 200 mg); patient recommended for inpatient psychiatric admission following medical stabilization, and psychiatry will continue to round while coordinating with primary medical team and OB/GYN services while patient is holding in the emergency room.  05/19/2024: Patient seen on rounds today while holding in emergency department under involuntary commitment with ongoing OB/GYN monitoring. No acceptance to any inpatient psychiatric units currently.  Patient continues to be cooperative with psychiatry team today. Patient denies current suicidal or homicidal ideations. Patient denied auditory or visual hallucinations currently as well. Patient was compliant with scheduled medications over night. Denied any current noted side effects. Psychiatry reached out to OBGYN for outpatient scheduling for continued care in the community. Patient reported to this provider today she is working with her supportive care home owner to arrange outpatient psychiatric services. This provider reached out Lay[place]oya who confirmed this information as well. Per [place], patient is allowed to return when discharged as well.   Diagnoses:  Active Hospital problems: Active Problems:   Schizoaffective disorder (HCC)    Plan   ## Disposition: Patient currently undergoing medical evaluation/workup, after medical clearance will be recommended for inpatient psychiatric admission.  Due to patient current presentation, psychiatry does recommend to uphold IVC at this  time.  ## Psychiatric Medication Recommendations: Haldol  and Benadryl  PRN for agitation/psychosis  only, limited use given 17-week pregnancy; use restricted to situations where benefits outweigh risks to maintain patient and staff safety  -Seroquel  50 mg nightly continued  *In a pregnant patient with acute psychosis, paranoia, impaired reasoning, refusing medical care (including prenatal care), and medication non-adherence with chronic medical conditions (diabetes, hypertension), the immediate risk of uncontrolled psychiatric illness outweighs the relatively well-studied risks of limited PRN Haldol  and Benadryl  use. This combination provides the longest safety track record in pregnancy for managing acute agitation and psychosis, while the PRN-only approach minimizes fetal exposure.*     ## Medical Decision Making Capacity: Not specifically addressed in this encounter  ## Further Work-up:  EKG- QTC: 442 on 05/17/2024 Labs: CBC, CMP, urine drug screen, ethanol  ## Behavioral / Environmental: -Utilize compassion and acknowledge the patient's experiences while setting clear and realistic expectations for care.    ## Safety and Observation Level:  - Based on my clinical evaluation, I estimate the patient to be at low risk of self harm in the current setting. - At this time, we recommend  routine. This decision is based on my review of the chart including patient's history and current presentation, interview of the patient, mental status examination, and consideration of suicide risk including evaluating suicidal ideation, plan, intent, suicidal or self-harm behaviors, risk factors, and protective factors. This judgment is based on our ability to directly address suicide risk, implement suicide prevention strategies, and develop a safety plan while the patient is in the clinical setting. Please contact our team if there is a concern that risk level has changed.  CSSR Risk Category:C-SSRS RISK  CATEGORY:  Flowsheet Row ED from 05/17/2024 in [facility] Emergency Department at [facility] ED to Hosp-Admission (Discharged) from 03/19/2018 in [facility] ED from 01/07/2018 in [facility] Emergency Department at [facility]  C-SSRS RISK CATEGORY No Risk No Risk Moderate Risk     Suicide Risk Assessment: Patient has following modifiable risk factors for suicide: medication noncompliance, lack of access to outpatient mental health resources, active mental illness (to encompass adhd, tbi, mania, psychosis, trauma reaction), and triggering events, which we are addressing by recommending inpatient psychiatric admission following medical stabilization/clearance.  Patient has following non-modifiable or demographic risk factors for suicide: psychiatric hospitalization  Patient has the following protective factors against suicide: Supportive friends and pregnancy  Thank you for this consult request. Recommendations have been communicated to the primary team.  We will sign off at this time.       History of Present Illness  Relevant Aspects of Hospital ED   Patient Report:   SUBJECTIVE: Patient presented to emergency department with concerns of having a skin condition that is deeper than you can see. Reports skin is stupid, itching and burning and all kinds of things but would not elaborate further, stating she would end up stuck here. Reports ongoing since September with no relief from treatments. Denies suicidal or homicidal ideations. When asked about auditory or visual hallucinations, patient loudly stated probably regarding visual hallucinations but refused to elaborate, reports ongoing since September as well. Reports doing fine. Currently staying in [place]. Confirmed that she is currently her own legal guardian. Gave permission for psychiatry to contact friend Latoya.  Reports not taking previously prescribed psychotropic  medications. Not taking diabetes or blood pressure medications. Patient is [redacted] weeks pregnant and has received no outpatient OB/GYN care.  Collateral from [facility] (patient gave permission; collateral requested information not be shared with patient due to risk  to therapeutic relationship): Patient reached out to [facility] on 1/17 stating she had no place to go in freezing temperatures. Approved to stay at one of Latoya's support living houses. Five years ago, patient resided there but was removed due to behaviors including throwing plates and chasing residents; Latoya notes vast improvement in these behaviors. Patient disclosed to Latoya that she stopped all medications, attempted to get pregnant, and will not take medications due to fear of harm during pregnancy. Latoya reports noticing patient looking past people or appearing to respond to internal stimuli and feels patient needs medications. Patient previously had court-ordered legal guardianship but regained own guardianship in 2022; Latoya voiced concerns about this decision. Reported that patient requires gentle approach as she becomes defensive and upset easily. Latoya expressed concerns regarding patient's current rationale and reasoning.  OBJECTIVE: Patient irritable, defensive, and tearful on exam. When psychiatry team introduced self, patient loudly stated See this is why I don't come anywhere always end up in the psych ward and became tearful. Appears very paranoid and anxious. Displaying impaired rationalization and reasoning. Example of impaired reasoning: I understand, but I can't when asked to provide blood sample for further medical evaluation, despite extensive explanation of need. Nursing staff reported patient looking at things that were not there and patient would not elaborate when asked. Exam limited due to patient's current status and refusal to engage fully. Patient refused blood sample, became irritable and tearful with impaired  reasoning when persuaded.  Collateral information from [facility]oya communicated to primary medical team; per collateral source request, information not to be shared with patient to preserve therapeutic relationship and trust  05/18/2024:  Patient was seen on rounds today by psychiatry. Patient continues to be monitored by OB/GYN services while holding in the emergency department; please reference their notes for detailed medical reports. Patient is currently under involuntary commitment due to concerns regarding medication noncompliance and decompensation in mental health status as documented in previous psychiatric consultation notes.  On today's examination, patient was notably more agreeable to discussing her current clinical status with this provider compared to prior assessments. Patient voiced frustration with her current circumstances but demonstrated improved cooperation and understanding of the need for psychiatric interventions during today's discussion. Of note, patient did require as-needed agitation medications earlier in the day due to behavioral concerns, please reference nursing documentation for detailed behavioral reports. Patient currently denies suicidal ideations and homicidal ideations. When questioned about auditory or visual hallucinations, patient endorsed their presence but reported difficulty explaining what she is experiencing. Patient expressed hesitation to discuss specific events, stating she feels as though she always ends up in psych wards and does not believe this is beneficial to her recovery. Patient did report increased anxiety related to multiple increased life stressors recently.  This provider engaged patient in thorough discussion regarding risks, benefits, and side effects of reinitiating Seroquel  for symptom management. Patient verbalized understanding of the information provided and gave informed consent at this time for low-dose Seroquel  to be initiated nightly.  Patient expressed preference for once-daily dosing to promote medication adherence, and this dosing schedule was incorporated into treatment planning. Patient reported that Seroquel  had worked well for her in the past, however, she noted that previous dosing at 200 mg, which she considered high, resulted in feeling like a zombie throughout the day with excessive sedation. After thorough discussion and shared decision-making, patient was agreeable to starting Seroquel  50 mg at bedtime, which was ordered accordingly.  Psychiatry team will continue to round on patient  while she is holding in the emergency department. Patient is recommended for inpatient psychiatric admission following medical stabilization and clearance from OB/GYN services. Psychiatry will continue to work collaboratively with the primary medical team and OB/GYN team while patient is holding in the emergency department awaiting appropriate psychiatric placement.   Psychiatric and Social History  Psychiatric History:  Information collected from patient/chart  Prev Dx/Sx: Schizoaffective disorder documented in chart Current Psych Provider: None currently Home Meds (current): Patient denied taking any current medications Previous Med Trials: Invega and Seroquel  listed in chart Therapy: Denied Prior Psych Hospitalization: Yes Prior Suicide attempt/Self Harm: Documented history of suicidal ideations Prior Violence: Documented history of aggressive behavior  Family Psych History: Unknown Family Hx suicide: Unknown  Social History:   Occupational Hx: Employed Legal Hx: Denied Living Situation: Patient just recently transition to living in supportive living house on January 17  Access to weapons/lethal means: Patient denied  Substance History Alcohol: Denied Tobacco: Denied Illicit drugs: Denied Prescription drug abuse: Denied Rehab hx: Denied  Exam Findings  Physical Exam: Reviewed and agree with the physical exam  findings conducted by the medical provider Vital Signs:  Temp:  [97.1 F (36.2 C)-97.6 F (36.4 C)] 97.6 F (36.4 C) (01/29 0848) Pulse Rate:  [84-101] 84 (01/29 0848) Resp:  [16-19] 17 (01/29 0848) BP: (118-165)/(74-107) 142/94 (01/29 0848) SpO2:  [96 %-100 %] 100 % (01/29 0848) Blood pressure (!) 142/94, pulse 84, temperature 97.6 F (36.4 C), temperature source Oral, resp. rate 17, height 5' 1 (1.549 m), weight 108.9 kg, SpO2 100%. Body mass index is 45.35 kg/m.    Mental Status Exam: General Appearance: Disheveled  Orientation:  Full (Time, Place, and Person)  Memory:  Fair  Concentration:  Concentration: Poor  Recall:  Fair  Attention  Poor  Eye Contact:  Fair  Speech:  Clear and Coherent  Language:  Fair  Volume:  Increased  Mood: Irritable  Affect:  Labile  Thought Process:  UTA  Thought Content:  Hallucinations: Visual  Suicidal Thoughts:  No  Homicidal Thoughts:  No  Judgement:  Poor  Insight:  Lacking  Psychomotor Activity:  Normal  Akathisia:  No  Fund of Knowledge:  UTA      Assets:  Housing Social Support  Cognition:  Impaired  ADL's:  Intact  AIMS (if indicated):        Other History   These have been pulled in through the EMR, reviewed, and updated if appropriate.  Family History:  The patient's family history is not on file.  Medical History: Past Medical History:  Diagnosis Date   Anemia    Antisocial personality disorder (HCC)    Bipolar 1 disorder (HCC)    Diabetes mellitus without complication (HCC)    GERD (gastroesophageal reflux disease)    Hyperprolactinemia    Hypertension    Intellectual disability    Narcissistic personality disorder (HCC)    Normocytic anemia    Personality disorder (HCC)    Schizoaffective disorder (HCC)     Surgical History: Past Surgical History:  Procedure Laterality Date   COLPOSCOPY       Medications:  Current Medications[1]  Allergies: Allergies[2]  A. Claudene, NP This note was  created using Scientist, clinical (histocompatibility and immunogenetics). Please excuse any inadvertent transcription errors. Case was discussed with supervising physician Dr. Jadapalle who is agreeable with current plan.       [1]  Current Facility-Administered Medications:    haloperidol  (HALDOL ) tablet 5 mg, 5 mg, Oral, Q6H PRN **OR**  haloperidol  lactate (HALDOL ) injection 5 mg, 5 mg, Intramuscular, Q6H PRN, Viviann Pastor, MD   labetalol  (NORMODYNE ) injection 20 mg, 20 mg, Intravenous, PRN **AND** labetalol  (NORMODYNE ) injection 40 mg, 40 mg, Intravenous, PRN **AND** labetalol  (NORMODYNE ) injection 80 mg, 80 mg, Intravenous, PRN **AND** hydrALAZINE  (APRESOLINE ) injection 10 mg, 10 mg, Intravenous, PRN **AND** [COMPLETED] Measure blood pressure, , , Once, Verdon Keen, MD   CBG monitoring, , , 4x Daily pc & hs **AND** insulin  aspart (novoLOG ) injection 0-16 Units, 0-16 Units, Subcutaneous, TID PC, Wilson, Danielle Renee, CNM, 3 Units at 05/18/24 1609   NIFEdipine  (PROCARDIA ) capsule 10 mg, 10 mg, Oral, PRN **AND** NIFEdipine  (PROCARDIA ) capsule 20 mg, 20 mg, Oral, PRN **AND** NIFEdipine  (PROCARDIA ) capsule 20 mg, 20 mg, Oral, PRN **AND** labetalol  (NORMODYNE ) injection 40 mg, 40 mg, Intravenous, PRN **AND** [COMPLETED] Measure blood pressure, , , Once, Oxley, Jennifer, CNM   NIFEdipine  (PROCARDIA -XL/NIFEDICAL-XL) 24 hr tablet 30 mg, 30 mg, Oral, Daily, Wilson, Danielle Renee, CNM, 30 mg at 05/19/24 9093   prenatal vitamin w/FE, FA (NATACHEW) chewable tablet 1 tablet, 1 tablet, Oral, Q1200, Nicholaus Rolland BRAVO, MD   QUEtiapine  (SEROQUEL ) tablet 50 mg, 50 mg, Oral, QHS, Yeshaya Vath B, NP, 25 mg at 05/18/24 2303  Current Outpatient Medications:    albuterol  (PROVENTIL  HFA;VENTOLIN  HFA) 108 (90 Base) MCG/ACT inhaler, Inhale 2 puffs into the lungs every 6 (six) hours as needed for wheezing or shortness of breath., Disp: , Rfl:    acetaminophen  (TYLENOL ) 650 MG CR tablet, Take 650 mg by mouth every 8 (eight) hours as needed for  pain. (Patient not taking: Reported on 05/17/2024), Disp: , Rfl:    alum & mag hydroxide-simeth (MAALOX/MYLANTA) 200-200-20 MG/5ML suspension, Take 30 mLs by mouth every 6 (six) hours as needed for indigestion or heartburn. (Patient not taking: Reported on 05/17/2024), Disp: , Rfl:    budesonide-formoterol  (SYMBICORT) 160-4.5 MCG/ACT inhaler, Inhale 2 puffs into the lungs 2 (two) times daily. (Patient not taking: Reported on 05/17/2024), Disp: , Rfl:    divalproex  (DEPAKOTE  SPRINKLE) 125 MG capsule, Take 16 capsules (2,000 mg total) by mouth at bedtime. (Patient not taking: Reported on 05/17/2024), Disp: 480 capsule, Rfl: 0   divalproex  (DEPAKOTE ) 500 MG DR tablet, Take 1,500 mg by mouth 2 (two) times daily. (Patient not taking: Reported on 05/17/2024), Disp: , Rfl:    hydrochlorothiazide  (HYDRODIURIL ) 25 MG tablet, Take 12.5 mg by mouth daily. (Patient not taking: Reported on 05/17/2024), Disp: , Rfl:    HYDROcodone -acetaminophen  (NORCO/VICODIN) 5-325 MG tablet, Take 1 tablet by mouth every 6 (six) hours as needed for severe pain. (Patient not taking: Reported on 05/17/2024), Disp: 10 tablet, Rfl: 0   hydrOXYzine  (ATARAX /VISTARIL ) 25 MG tablet, Take 25 mg by mouth every 6 (six) hours as needed. (Patient not taking: Reported on 05/17/2024), Disp: , Rfl:    hydrOXYzine  (VISTARIL ) 25 MG capsule, Take 25 mg by mouth 3 (three) times daily as needed. (Patient not taking: Reported on 05/17/2024), Disp: , Rfl:    ibuprofen (ADVIL,MOTRIN) 400 MG tablet, Take 400 mg by mouth every 6 (six) hours as needed for moderate pain. (Patient not taking: Reported on 05/17/2024), Disp: , Rfl:    Levonorgest-Eth Estrad-Fe Bisg (BALCOLTRA) 0.1-20 MG-MCG(21) TABS, Take 1 tablet by mouth daily. (Patient not taking: Reported on 05/17/2024), Disp: , Rfl:    lisinopril  (PRINIVIL ,ZESTRIL ) 40 MG tablet, Take 40 mg by mouth daily. (Patient not taking: Reported on 05/17/2024), Disp: , Rfl:    Melatonin 3 MG TABS, Take 6 mg by mouth  at bedtime.  (Patient not taking: Reported on 05/17/2024), Disp: , Rfl:    metFORMIN (GLUCOPHAGE) 1000 MG tablet, Take 1,000 mg by mouth 2 (two) times daily with a meal. (Patient not taking: Reported on 05/17/2024), Disp: , Rfl:    omega-3 acid ethyl esters (LOVAZA ) 1 g capsule, Take 1 g by mouth 3 (three) times daily. (Patient not taking: Reported on 05/17/2024), Disp: , Rfl:    paliperidone (INVEGA SUSTENNA) 234 MG/1.5ML SUSY injection, Inject 234 mg into the muscle every 30 (thirty) days. (Patient not taking: Reported on 05/17/2024), Disp: , Rfl:    polyethylene glycol (MIRALAX  / GLYCOLAX ) packet, Take 17 g by mouth daily as needed for mild constipation. (Patient not taking: Reported on 05/17/2024), Disp: , Rfl:    prazosin  (MINIPRESS ) 2 MG capsule, Take 4 mg by mouth at bedtime. (Patient not taking: Reported on 05/17/2024), Disp: , Rfl:    QUEtiapine  (SEROQUEL ) 200 MG tablet, Take 200 mg by mouth at bedtime. (Patient not taking: Reported on 05/17/2024), Disp: , Rfl:    ranitidine (ZANTAC) 150 MG tablet, Take 150 mg by mouth daily. (Patient not taking: Reported on 05/17/2024), Disp: , Rfl:  [2]  Allergies Allergen Reactions   Klonopin [Clonazepam] Other (See Comments)    Seizures   Other Hives and Other (See Comments)    [place]

## 2024-05-19 NOTE — ED Notes (Signed)
 IVC/Pending placement

## 2024-05-19 NOTE — ED Notes (Signed)
 Pt given dinner tray.

## 2024-05-19 NOTE — ED Notes (Signed)
 Meal tray provided. Tray checked for any potential hazards. Pt denies no additional needs at this time.

## 2024-05-19 NOTE — ED Provider Notes (Signed)
 Clinical Course as of 05/19/24 1541  Tue May 17, 2024  0723 [ ]  Received signout  [HD]  1541 No issues on my shift and patient signed out to oncoming physician at 3 PM [HD]    Clinical Course User Index [DS] Claudene Rover, MD [HD] Nicholaus Rolland BRAVO, MD [MM] Clarine Ozell LABOR, MD      Nicholaus Rolland BRAVO, MD 05/19/24 [phone]

## 2024-05-19 NOTE — ED Provider Notes (Signed)
 Emergency Medicine Observation Re-evaluation Note  Tracie Hanson is a 34 y.o. female, seen on rounds today.  Pt initially presented to the ED for complaints of Abrasion  Currently, the patient is calm, no acute complaints.  Physical Exam  Blood pressure 129/77, pulse 87, temperature (!) 97.1 F (36.2 C), temperature source Oral, resp. rate 19, height 5' 1 (1.549 m), weight 108.9 kg, SpO2 100%. Physical Exam General: NAD Lungs: CTAB Psych: not agitated  ED Course / MDM  EKG:    I have reviewed the labs performed to date as well as medications administered while in observation.  Recent changes in the last 24 hours include no acute events overnight.    Plan  Current plan is for psych eval. Patient is under full IVC at this time.   Viviann Pastor, MD 05/19/24 [phone]

## 2024-05-19 NOTE — ED Notes (Signed)
 Blood sugar was checked and blood sugar was 86

## 2024-05-19 NOTE — ED Notes (Signed)
 Meal provided

## 2024-05-19 NOTE — Inpatient Diabetes Management (Signed)
 ADA Standards of Care 2023 Diabetes in Pregnancy Target Glucose Ranges:  Fasting: 70 - 95 mg/dL 1 hr postprandial:  889 - 140mg /dL (from first bite of meal) 2 hr postprandial:  100 - 120 mg/dL (from first bit of meal)   Latest Reference Range & Units 05/19/24 07:35 05/19/24 09:54  Glucose-Capillary 70 - 99 mg/dL 69 (L)  Fasting 86  2hr PP  (L): Data is abnormally low   Admit with: Facial skin lesions and lack of prenatal care  Has been out of DM and HTN meds IVC'd in the ED [redacted]w[redacted]d pregnant    History: DM2, Intellectual Disability, Bipolar Schizophrenia    Home DM Meds: Metformin 1000 mg BID (NOT taking)   Current Orders: Novolog  0-16 units TID after meals     Note orders placed by CNM for CBG checks AM Fasting and 2 hr postprandial and Novolog  0-16 units TID 2hr postprandial  CBGs OK so far  Will follow   --Will follow patient during hospitalization--  Adina Rudolpho Arrow RN, MSN, CDCES Diabetes Coordinator Inpatient Glycemic Control Team Team Pager: [phone] (8a-5p)

## 2024-05-20 ENCOUNTER — Other Ambulatory Visit: Payer: Self-pay

## 2024-05-20 LAB — CBG MONITORING, ED
Glucose-Capillary: 76 mg/dL (ref 70–99)
Glucose-Capillary: 86 mg/dL (ref 70–99)

## 2024-05-20 MED ORDER — ACETAMINOPHEN 500 MG PO TABS
1000.0000 mg | ORAL_TABLET | Freq: Once | ORAL | Status: AC
  Administered 2024-05-20: 1000 mg via ORAL
  Filled 2024-05-20: qty 2

## 2024-05-20 MED ORDER — COMPLETENATE 29-1 MG PO CHEW
1.0000 | CHEWABLE_TABLET | Freq: Every day | ORAL | 0 refills | Status: AC
  Filled 2024-05-20: qty 30, 30d supply, fill #0

## 2024-05-20 MED ORDER — QUETIAPINE FUMARATE 50 MG PO TABS
50.0000 mg | ORAL_TABLET | Freq: Every day | ORAL | 0 refills | Status: AC
  Filled 2024-05-20: qty 30, 30d supply, fill #0

## 2024-05-20 MED ORDER — NIFEDIPINE ER OSMOTIC RELEASE 30 MG PO TB24
30.0000 mg | ORAL_TABLET | Freq: Every day | ORAL | 11 refills | Status: AC
  Filled 2024-05-20: qty 30, 30d supply, fill #0

## 2024-05-20 MED ORDER — ALUM & MAG HYDROXIDE-SIMETH 200-200-20 MG/5ML PO SUSP
30.0000 mL | Freq: Once | ORAL | Status: AC
  Administered 2024-05-20: 30 mL via ORAL
  Filled 2024-05-20: qty 30

## 2024-05-20 NOTE — Progress Notes (Signed)
 Per Zelda, NP, provided patient's resources for outpatient follow-up services to [place], [place].  [place], [facility]: [place] [address]onda W [phone]

## 2024-05-20 NOTE — ED Notes (Addendum)
 This RN attempted to give pt morning medication and collect CBG sample. Pt only took tylenol  for her headache. Pt became agitated and wanted to leave. Pt educated about IVC orders. Pt disagree and wanted to leave claiming she does not want to be admitted to impatient psych because it would not help me at all, I can get help outpatient, let me go Pt redirected to room and explained that she needs to stay calm. Pt sitting in bed crying. MD Ernest notified

## 2024-05-20 NOTE — Consult Note (Signed)
 [facility] Psychiatric Consult Follow Up  Patient Name: .Tracie Hanson  MRN: [id number]  DOB: [birth year]  Consult Order details:  Orders (From admission, onward)     Start     Ordered   05/17/24 1220  CONSULT TO CALL ACT TEAM       Ordering Provider: Nicholaus Rolland BRAVO, MD  Provider:  (Not yet assigned)  Question:  Reason for Consult?  Answer:  Psych consult   05/17/24 1220   05/17/24 1220  IP CONSULT TO PSYCHIATRY       Ordering Provider: Nicholaus Rolland BRAVO, MD  Provider:  (Not yet assigned)  Question:  Reason for consult:  Answer:  Medication management   05/17/24 1220             Mode of Visit: In person    Psychiatry Consult Evaluation  Service Date: May 20, 2024 LOS:  LOS: 0 days  Chief Complaint My skin is being stupid  Primary Psychiatric Diagnoses  Schizoaffective disorder [facility])   Assessment  CLINICAL DECISION MAKING:  Patient with psychiatric history (schizoaffective disorder noted in chart) initially presenting with concerns of skin condition, likely visual hallucinations since September, paranoia, impaired reasoning, and appearing to respond to internal stimuli. Currently [redacted] weeks pregnant without prenatal care. Non-adherent with all medications including psychotropics, diabetes, and blood pressure medications. Risk to self due to medication non-adherence, impaired judgment, and paranoid presentation with psychotic symptoms.  UPDATED PLAN 05/20/2024:  Patient was referred to multiple inpatient psychiatric facilities, all of which declined admission citing history of IDD documented in chart. Psychiatry team has continued to round and provide medication management while patient held in ED.  On today's examination, patient denies current suicidal or homicidal ideation and denies auditory or visual hallucinations. Mental status shows significant improvement compared to initial presentation, with markedly decreased paranoia. Patient expresses frustration with  prolonged ED stay but demonstrates appropriate insight and ability to articulate concerns.  Patient has been compliant with Seroquel  50 mg nightly without reported side effects and agrees to continue medication in the community. Patient is agreeable to outpatient mental health follow-up and OB/GYN follow-up (arranged by OBGYN team) as arranged by behavioral health coordinator Suzen.  Spoke with Laytoya today, 05/20/2024, supportive living home owner, who reports no current safety concerns and confirms patient may return to facility. Laytoya requested assurance of adequate OB/GYN and mental health follow-up, which has been coordinated. Primary medical team updated regarding OB/GYN follow-up needs.  Patient no longer meets criteria for involuntary commitment. Patient is psychiatrically cleared for discharge with community mental health follow-up.   Diagnoses:  Active Hospital problems: Active Problems:   Schizoaffective disorder (HCC)    Plan   ## Disposition: Patient is psychiatrically cleared for discharge with community mental health follow-up.  ## Psychiatric Medication Recommendations:  -Seroquel  50 mg nightly continued      ## Medical Decision Making Capacity: Not specifically addressed in this encounter  ## Further Work-up:  EKG- QTC: 442 on 05/17/2024 Labs: CBC, CMP, urine drug screen, ethanol  ## Behavioral / Environmental: -Utilize compassion and acknowledge the patient's experiences while setting clear and realistic expectations for care.    ## Safety and Observation Level:  - Based on my clinical evaluation, I estimate the patient to be at low risk of self harm in the current setting. - At this time, we recommend  routine. This decision is based on my review of the chart including patient's history and current presentation, interview of the patient, mental status examination, and consideration  of suicide risk including evaluating suicidal ideation, plan, intent,  suicidal or self-harm behaviors, risk factors, and protective factors. This judgment is based on our ability to directly address suicide risk, implement suicide prevention strategies, and develop a safety plan while the patient is in the clinical setting. Please contact our team if there is a concern that risk level has changed.  CSSR Risk Category:C-SSRS RISK CATEGORY:  Flowsheet Row ED from 05/17/2024 in [facility] Department at [facility] ED to Hosp-Admission (Discharged) from 03/19/2018 in [facility] ED from 01/07/2018 in [facility] Emergency Department at [facility]  C-SSRS RISK CATEGORY No Risk No Risk Moderate Risk     Suicide Risk Assessment: Patient has following modifiable risk factors for suicide: medication noncompliance, lack of access to outpatient mental health resources, active mental illness (to encompass adhd, tbi, mania, psychosis, trauma reaction), and triggering events, which we are addressing by maximizing medication management while holding in the ED and providing therapeutic communication for patient to discuss concerns as well as ensure adequate community mental health follow-up.  Patient has following non-modifiable or demographic risk factors for suicide: psychiatric hospitalization  Patient has the following protective factors against suicide: Supportive friends and pregnancy  Thank you for this consult request. Recommendations have been communicated to the primary team.  We will sign off at this time.       History of Present Illness  Relevant Aspects of Hospital ED   Patient Report:   Patient no longer meets criteria for involuntary commitment. Patient does not pose current imminent danger to self or others, as evidenced by denial of suicidal or homicidal ideation, absence of psychotic symptoms, and confirmation from supportive living facility that patient is not a current safety risk. Patient , shows  medication compliance, and agrees to continue psychiatric care in the community. Safe discharge plan is in place with appropriate supports and follow-up arranged. Patient is psychiatrically cleared for discharge with community mental health follow-up.         Psychiatric and Social History  Psychiatric History:  Information collected from patient/chart  Prev Dx/Sx: Schizoaffective disorder documented in chart Current Psych Provider: None currently Home Meds (current): Patient denied taking any current medications Previous Med Trials: Invega and Seroquel  listed in chart Therapy: Denied Prior Psych Hospitalization: Yes Prior Suicide attempt/Self Harm: Documented history of suicidal ideations Prior Violence: Documented history of aggressive behavior  Family Psych History: Unknown Family Hx suicide: Unknown  Social History:   Occupational Hx: Employed Legal Hx: Denied Living Situation: Patient just recently transition to living in supportive living house on January 17  Access to weapons/lethal means: Patient denied  Substance History Alcohol: Denied Tobacco: Denied Illicit drugs: Denied Prescription drug abuse: Denied Rehab hx: Denied  Exam Findings  Physical Exam: Reviewed and agree with the physical exam findings conducted by the medical provider Vital Signs:  Temp:  [98 F (36.7 C)-98.2 F (36.8 C)] 98.1 F (36.7 C) (01/30 0759) Pulse Rate:  [61-97] 96 (01/30 0759) Resp:  [17-18] 17 (01/30 0759) BP: (131-155)/(76-96) 151/96 (01/30 0759) SpO2:  [100 %] 100 % (01/30 0759) Blood pressure (!) 151/96, pulse 96, temperature 98.1 F (36.7 C), temperature source Oral, resp. rate 17, height 5' 1 (1.549 m), weight 108.9 kg, SpO2 100%. Body mass index is 45.35 kg/m.    Mental Status Exam: General Appearance: WNL  Orientation:  Full (Time, Place, and Person)  Memory:  Fair  Concentration:  Fair  Recall:  Fair  Attention  Fair  Eye Contact:  Fair  Speech:  Clear  and Coherent  Language:  Fair  Volume:  Normal  Mood: Anxious  Affect:  Congruent  Thought Process:  Logical  Thought Content:  WNL  Suicidal Thoughts:  No  Homicidal Thoughts:  No  Judgement:  Improving  Insight:  Improving  Psychomotor Activity:  Normal  Akathisia:  No  Fund of Knowledge:  Fair      Assets:  Housing Social Support  Cognition:  Improved  ADL's:  Intact  AIMS (if indicated):        Other History   These have been pulled in through the EMR, reviewed, and updated if appropriate.  Family History:  The patient's family history is not on file.  Medical History: Past Medical History:  Diagnosis Date   Anemia    Antisocial personality disorder (HCC)    Bipolar 1 disorder (HCC)    Diabetes mellitus without complication (HCC)    GERD (gastroesophageal reflux disease)    Hyperprolactinemia    Hypertension    Intellectual disability    Narcissistic personality disorder (HCC)    Normocytic anemia    Personality disorder (HCC)    Schizoaffective disorder (HCC)     Surgical History: Past Surgical History:  Procedure Laterality Date   COLPOSCOPY       Medications:  Current Medications[1]  Allergies: Allergies[2]  A. Claudene, NP This note was created using Scientist, clinical (histocompatibility and immunogenetics). Please excuse any inadvertent transcription errors. Case was discussed with supervising physician Dr. Jadapalle who is agreeable with current plan.       [1]  Current Facility-Administered Medications:    haloperidol  (HALDOL ) tablet 5 mg, 5 mg, Oral, Q6H PRN **OR** haloperidol  lactate (HALDOL ) injection 5 mg, 5 mg, Intramuscular, Q6H PRN, Viviann Pastor, MD   labetalol  (NORMODYNE ) injection 20 mg, 20 mg, Intravenous, PRN **AND** labetalol  (NORMODYNE ) injection 40 mg, 40 mg, Intravenous, PRN **AND** labetalol  (NORMODYNE ) injection 80 mg, 80 mg, Intravenous, PRN **AND** hydrALAZINE  (APRESOLINE ) injection 10 mg, 10 mg, Intravenous, PRN **AND** [COMPLETED] Measure blood  pressure, , , Once, Verdon Keen, MD   CBG monitoring, , , 4x Daily pc & hs **AND** insulin  aspart (novoLOG ) injection 0-16 Units, 0-16 Units, Subcutaneous, TID PC, Wilson, Danielle Renee, CNM, 3 Units at 05/18/24 1609   NIFEdipine  (PROCARDIA ) capsule 10 mg, 10 mg, Oral, PRN **AND** NIFEdipine  (PROCARDIA ) capsule 20 mg, 20 mg, Oral, PRN **AND** NIFEdipine  (PROCARDIA ) capsule 20 mg, 20 mg, Oral, PRN **AND** labetalol  (NORMODYNE ) injection 40 mg, 40 mg, Intravenous, PRN **AND** [COMPLETED] Measure blood pressure, , , Once, Oxley, Delon, CNM   NIFEdipine  (PROCARDIA -XL/NIFEDICAL-XL) 24 hr tablet 30 mg, 30 mg, Oral, Daily, Wilson, Danielle Renee, CNM, 30 mg at 05/19/24 9093   prenatal vitamin w/FE, FA (NATACHEW) chewable tablet 1 tablet, 1 tablet, Oral, Q1200, Nicholaus Rolland BRAVO, MD, 1 tablet at 05/19/24 1135   QUEtiapine  (SEROQUEL ) tablet 50 mg, 50 mg, Oral, QHS, Azadeh Hyder B, NP, 25 mg at 05/19/24 2141  Current Outpatient Medications:    albuterol  (PROVENTIL  HFA;VENTOLIN  HFA) 108 (90 Base) MCG/ACT inhaler, Inhale 2 puffs into the lungs every 6 (six) hours as needed for wheezing or shortness of breath., Disp: , Rfl:    acetaminophen  (TYLENOL ) 650 MG CR tablet, Take 650 mg by mouth every 8 (eight) hours as needed for pain. (Patient not taking: Reported on 05/17/2024), Disp: , Rfl:    alum & mag hydroxide-simeth (MAALOX/MYLANTA) 200-200-20 MG/5ML suspension, Take 30 mLs by mouth every 6 (six) hours as needed  for indigestion or heartburn. (Patient not taking: Reported on 05/17/2024), Disp: , Rfl:    budesonide-formoterol  (SYMBICORT) 160-4.5 MCG/ACT inhaler, Inhale 2 puffs into the lungs 2 (two) times daily. (Patient not taking: Reported on 05/17/2024), Disp: , Rfl:    divalproex  (DEPAKOTE  SPRINKLE) 125 MG capsule, Take 16 capsules (2,000 mg total) by mouth at bedtime. (Patient not taking: Reported on 05/17/2024), Disp: 480 capsule, Rfl: 0   divalproex  (DEPAKOTE ) 500 MG DR tablet, Take 1,500 mg by mouth 2  (two) times daily. (Patient not taking: Reported on 05/17/2024), Disp: , Rfl:    hydrochlorothiazide  (HYDRODIURIL ) 25 MG tablet, Take 12.5 mg by mouth daily. (Patient not taking: Reported on 05/17/2024), Disp: , Rfl:    HYDROcodone -acetaminophen  (NORCO/VICODIN) 5-325 MG tablet, Take 1 tablet by mouth every 6 (six) hours as needed for severe pain. (Patient not taking: Reported on 05/17/2024), Disp: 10 tablet, Rfl: 0   hydrOXYzine  (ATARAX /VISTARIL ) 25 MG tablet, Take 25 mg by mouth every 6 (six) hours as needed. (Patient not taking: Reported on 05/17/2024), Disp: , Rfl:    hydrOXYzine  (VISTARIL ) 25 MG capsule, Take 25 mg by mouth 3 (three) times daily as needed. (Patient not taking: Reported on 05/17/2024), Disp: , Rfl:    ibuprofen (ADVIL,MOTRIN) 400 MG tablet, Take 400 mg by mouth every 6 (six) hours as needed for moderate pain. (Patient not taking: Reported on 05/17/2024), Disp: , Rfl:    Levonorgest-Eth Estrad-Fe Bisg (BALCOLTRA) 0.1-20 MG-MCG(21) TABS, Take 1 tablet by mouth daily. (Patient not taking: Reported on 05/17/2024), Disp: , Rfl:    lisinopril  (PRINIVIL ,ZESTRIL ) 40 MG tablet, Take 40 mg by mouth daily. (Patient not taking: Reported on 05/17/2024), Disp: , Rfl:    Melatonin 3 MG TABS, Take 6 mg by mouth at bedtime. (Patient not taking: Reported on 05/17/2024), Disp: , Rfl:    metFORMIN (GLUCOPHAGE) 1000 MG tablet, Take 1,000 mg by mouth 2 (two) times daily with a meal. (Patient not taking: Reported on 05/17/2024), Disp: , Rfl:    omega-3 acid ethyl esters (LOVAZA ) 1 g capsule, Take 1 g by mouth 3 (three) times daily. (Patient not taking: Reported on 05/17/2024), Disp: , Rfl:    paliperidone (INVEGA SUSTENNA) 234 MG/1.5ML SUSY injection, Inject 234 mg into the muscle every 30 (thirty) days. (Patient not taking: Reported on 05/17/2024), Disp: , Rfl:    polyethylene glycol (MIRALAX  / GLYCOLAX ) packet, Take 17 g by mouth daily as needed for mild constipation. (Patient not taking: Reported on 05/17/2024),  Disp: , Rfl:    prazosin  (MINIPRESS ) 2 MG capsule, Take 4 mg by mouth at bedtime. (Patient not taking: Reported on 05/17/2024), Disp: , Rfl:    QUEtiapine  (SEROQUEL ) 200 MG tablet, Take 200 mg by mouth at bedtime. (Patient not taking: Reported on 05/17/2024), Disp: , Rfl:    ranitidine (ZANTAC) 150 MG tablet, Take 150 mg by mouth daily. (Patient not taking: Reported on 05/17/2024), Disp: , Rfl:  [2]  Allergies Allergen Reactions   Klonopin [Clonazepam] Other (See Comments)    Seizures   Other Hives and Other (See Comments)    Celery

## 2024-05-20 NOTE — Inpatient Diabetes Management (Signed)
 Inpatient Diabetes Program Recommendations  AACE/ADA: New Consensus Statement on Inpatient Glycemic Control (2015)  Target Ranges:  Prepandial:   less than 140 mg/dL      Peak postprandial:   less than 180 mg/dL (1-2 hours)      Critically ill patients:  140 - 180 mg/dL    Latest Reference Range & Units 05/19/24 07:35 05/19/24 09:54 05/19/24 18:43  Glucose-Capillary 70 - 99 mg/dL 69 (L) 86 94  (L): Data is abnormally low  Latest Reference Range & Units 05/20/24 07:15 05/20/24 14:11  Glucose-Capillary 70 - 99 mg/dL 76 86     Admit with: Facial skin lesions and lack of prenatal care  Has been out of DM and HTN meds IVC'd in the ED [redacted]w[redacted]d pregnant    History: DM2, Intellectual Disability, Bipolar Schizophrenia    Home DM Meds: Metformin 1000 mg BID (NOT taking)   Current Orders: Novolog  0-16 units TID after meals     CBGs OK so far Signing off DM Coordinator referral Please re-consult if CBGs become uncontrolled and glucose management assistance needed   --Will follow patient during hospitalization--  Adina Rudolpho Arrow RN, MSN, CDCES Diabetes Coordinator Inpatient Glycemic Control Team Team Pager: [phone] (8a-5p)

## 2024-05-20 NOTE — ED Notes (Signed)
 Meal provided

## 2024-05-20 NOTE — ED Provider Notes (Signed)
 Cleared for discharge by psychiatry, IVC rescinded.  I sent prescriptions for antihypertensives, Procardia  XL 30 mg once daily.  Antipsychotics and prenatal vitamins already prescribed.  Should have follow-up with [facility] clinic OB/GYN.  Group home assisting with transport.   How to take prescriptions and next steps for the patient are clearly documented on her DC summary for the group home.   Claudene Rover, MD 05/20/24 [phone]

## 2024-05-20 NOTE — ED Notes (Signed)
 Meal tray provided.

## 2024-05-20 NOTE — ED Notes (Signed)
rescinded 

## 2024-05-20 NOTE — ED Notes (Signed)
 Patient is resting comfortably.

## 2024-05-20 NOTE — ED Notes (Signed)
 ivc/pending placement.Marland Kitchen

## 2024-05-20 NOTE — ED Notes (Signed)
 Pt refuse fetal heart tones check at this time claiming she does not have any pregnancy concerns

## 2024-05-20 NOTE — Discharge Instructions (Addendum)
 Seroquel  (quetiapine ) 50mg , 1 tablet, every night at bedtime.   Procardia  XL (nifedipine ) 30mg , 1 tablet, once daily every day. Does not matter if taken in AM or PM, but take consistently each day.   Prenatal vitamin once daily every day  [facility] should be reaching out to schedule follow up in their clinic. I've attached their phone number as well for reference if you do not hear from them within the next week.   Return to the ED with any worsening symptoms.

## 2024-05-20 NOTE — ED Provider Notes (Signed)
 Emergency Medicine Observation Re-evaluation Note   BP (!) 155/94   Pulse 97   Temp 98.2 F (36.8 C)   Resp 18   Ht 5' 1 (1.549 m)   Wt 108.9 kg   LMP  (LMP Unknown)   SpO2 100%   BMI 45.35 kg/m    ED Course / MDM   No reported events during my shift at the time of this note.   Pt is awaiting dispo from consultants   Ginnie Shams MD    Shams Ginnie, MD 05/20/24 [phone]
